# Patient Record
Sex: Female | Born: 1990
Health system: Southern US, Community
[De-identification: ages and names within clinical notes are randomized; demographics above are authoritative.]

## PROBLEM LIST (undated history)

## (undated) DIAGNOSIS — R7303 Prediabetes: Secondary | ICD-10-CM

## (undated) DIAGNOSIS — E669 Obesity, unspecified: Secondary | ICD-10-CM

## (undated) DIAGNOSIS — D649 Anemia, unspecified: Secondary | ICD-10-CM

## (undated) DIAGNOSIS — I10 Essential (primary) hypertension: Secondary | ICD-10-CM

## (undated) HISTORY — DX: Anemia, unspecified: D64.9

## (undated) HISTORY — PX: TONSILLECTOMY: SUR1361

## (undated) HISTORY — DX: Prediabetes: R73.03

---
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
## 2015-08-28 ENCOUNTER — Encounter (HOSPITAL_COMMUNITY): Payer: Self-pay

## 2015-08-28 ENCOUNTER — Observation Stay (HOSPITAL_COMMUNITY)
Admission: EM | Admit: 2015-08-28 | Discharge: 2015-08-29 | Disposition: A | Discharge status: Other Acute Inpatient Hospital | Payer: PRIVATE HEALTH INSURANCE | Source: Intra-hospital | Attending: Obstetrics & Gynecology | Admitting: Obstetrics & Gynecology

## 2015-08-28 DIAGNOSIS — D500 Iron deficiency anemia secondary to blood loss (chronic): Secondary | ICD-10-CM | POA: Diagnosis not present

## 2015-08-28 DIAGNOSIS — N939 Abnormal uterine and vaginal bleeding, unspecified: Principal | ICD-10-CM | POA: Insufficient documentation

## 2015-08-28 DIAGNOSIS — D649 Anemia, unspecified: Secondary | ICD-10-CM

## 2015-08-28 HISTORY — DX: Obesity, unspecified: E66.9

## 2015-08-28 LAB — CBC WITH DIFFERENTIAL/PLATELET
Basophils Absolute: 0 10*3/uL (ref 0.0–0.1)
Basophils Relative: 0 %
Eosinophils Absolute: 0.2 10*3/uL (ref 0.0–0.7)
Eosinophils Relative: 2 %
HCT: 18.8 % — ABNORMAL LOW (ref 36.0–46.0)
Hemoglobin: 4.9 g/dL — CL (ref 12.0–15.0)
Lymphocytes Relative: 31 %
Lymphs Abs: 3.7 10*3/uL (ref 0.7–4.0)
MCH: 15.6 pg — ABNORMAL LOW (ref 26.0–34.0)
MCHC: 26.1 g/dL — ABNORMAL LOW (ref 30.0–36.0)
MCV: 59.7 fL — ABNORMAL LOW (ref 78.0–100.0)
Monocytes Absolute: 0.7 10*3/uL (ref 0.1–1.0)
Monocytes Relative: 6 %
Neutro Abs: 7.2 10*3/uL (ref 1.7–7.7)
Neutrophils Relative %: 61 %
Platelets: 537 10*3/uL — ABNORMAL HIGH (ref 150–400)
RBC: 3.15 MIL/uL — ABNORMAL LOW (ref 3.87–5.11)
RDW: 22.9 % — ABNORMAL HIGH (ref 11.5–15.5)
WBC: 11.8 10*3/uL — ABNORMAL HIGH (ref 4.0–10.5)

## 2015-08-28 LAB — COMPREHENSIVE METABOLIC PANEL
ALT: 15 U/L (ref 14–54)
AST: 21 U/L (ref 15–41)
Albumin: 3.9 g/dL (ref 3.5–5.0)
Alkaline Phosphatase: 55 U/L (ref 38–126)
Anion gap: 9 (ref 5–15)
BUN: 11 mg/dL (ref 6–20)
CO2: 23 mmol/L (ref 22–32)
Calcium: 8.9 mg/dL (ref 8.9–10.3)
Chloride: 107 mmol/L (ref 101–111)
Creatinine, Ser: 0.63 mg/dL (ref 0.44–1.00)
GFR calc Af Amer: >60 mL/min (ref >60)
GFR calc non Af Amer: >60 mL/min (ref >60)
Glucose, Bld: 87 mg/dL (ref 65–99)
Potassium: 4.1 mmol/L (ref 3.5–5.1)
Sodium: 139 mmol/L (ref 135–145)
Total Bilirubin: 0.6 mg/dL (ref 0.3–1.2)
Total Protein: 7.3 g/dL (ref 6.5–8.1)

## 2015-08-28 LAB — ABO/RH: ABO/RH(D): O POS

## 2015-08-28 LAB — POC OCCULT BLOOD, ED: Fecal Occult Bld: NEGATIVE

## 2015-08-28 LAB — CBG MONITORING, ED: Glucose-Capillary: 83 mg/dL (ref 65–99)

## 2015-08-28 LAB — PREPARE RBC (CROSSMATCH)

## 2015-08-28 MED ORDER — ESTROGENS CONJUGATED 25 MG IJ SOLR
25.0000 mg | Freq: Once | INTRAMUSCULAR | Status: AC
  Administered 2015-08-28: 25 mg via INTRAVENOUS
  Filled 2015-08-28: qty 25

## 2015-08-28 MED ORDER — SODIUM CHLORIDE 0.9 % IV SOLN
1000.0000 mL | Freq: Once | INTRAVENOUS | Status: AC
  Administered 2015-08-28: 1000 mL via INTRAVENOUS

## 2015-08-28 MED ORDER — SODIUM CHLORIDE 0.9 % IV SOLN
1000.0000 mL | INTRAVENOUS | Status: DC
  Administered 2015-08-28 – 2015-08-29 (×2): 1000 mL via INTRAVENOUS

## 2015-08-28 MED ORDER — MEGESTROL ACETATE 40 MG PO TABS
120.0000 mg | ORAL_TABLET | Freq: Every day | ORAL | Status: AC
  Administered 2015-08-28: 120 mg via ORAL
  Filled 2015-08-28: qty 3

## 2015-08-28 NOTE — ED Notes (Signed)
IV team unable to perform blood draw during peripheral IV access attempt.  Main lab phlebotomist notified that we need help obtaining blood samples.

## 2015-08-28 NOTE — ED Notes (Signed)
1x attempt at blood draw - unsuccessful - RN aware - hard stick

## 2015-08-28 NOTE — ED Notes (Signed)
Patient reports a menstrual period x 30 days. Patient c/o dizziness and feeling tired. Patient went to an UC today and was told to come to the ED because Hgb-5.3.

## 2015-08-28 NOTE — ED Notes (Signed)
Per Dr. Radford PaxBeaton, infuse blood at 26050mL/hr

## 2015-08-28 NOTE — ED Notes (Signed)
Main lab phlebotomist unable to draw blood for labs.  Provider notified.

## 2015-08-28 NOTE — ED Provider Notes (Signed)
CSN: [id number]     Arrival date & time 08/28/15  1702 History   First MD Initiated Contact with Patient 08/28/15 1712     Chief Complaint  Patient presents with  . low hgb-5.3      (Consider location/radiation/quality/duration/timing/severity/associated sxs/prior Treatment) The history is provided by the patient.     Patient is a 25 year old morbidly obese female, otherwise healthy, who presents to the emergency room as a transfer from urgent care due to lab hemoglobin of 5.3, after having 6 weeks of vaginal bleeding, became symptomatic last night feeling lightheaded, dizzy and feeling a rapid heart rate.  She states that today she is only saturated one tampon and her period feels like it has finally slowed down.  She denies  Current vaginal bleeding, she has not passed any large blood clots today.  Her last period began the beginning of December and has been constant since then, with estimated 4 pads soaked per day.  She has a hx of heavy and frequent periods.  She has been on birth control in the past to help regulate menses, which she states was effective, but reports that she stopped using BC due to excessive weight gain.  She estimates "several months" of more frequent long periods.  In October she had a period for two weeks, then reports two weeks without vaginal bleeding.  She normally has two days of light cramping at the beginning of her cycle.  She last had sex in October, has had a negative home pregnancy test since then, and denies possibility of pregnancy. At the time of exam she endorses HA and lightheadedness with positional changes or exertion, has exertional SOB and feels rapid heart rate, and otherwise feels generally weak  She felt symptoms yesterday.  She denies chest pain, SOB at rest, abdominal pain, pelvic pain.  She denies pallor, color change, syncope.  She denies hematemesis, hematochezia, melena. No other acute complaints.  She denies any family or personal bleeding  disorders.  She denies having blood transfusion before.   Past Medical History  Diagnosis Date  . Obesity    Past Surgical History  Procedure Laterality Date  . Tonsillectomy     Family History  Problem Relation Age of Onset  . Cancer Father    Social History  Substance Use Topics  . Smoking status: Never Smoker   . Smokeless tobacco: Never Used  . Alcohol Use: Yes     Comment: occasionally   OB History    No data available     Review of Systems  Constitutional: Positive for fatigue. Negative for fever, chills, diaphoresis, activity change and appetite change.  Eyes: Negative.   Respiratory: Negative for apnea, cough, choking, chest tightness, wheezing and stridor.   Cardiovascular: Negative for chest pain, palpitations and leg swelling.  Gastrointestinal: Negative.  Negative for nausea, vomiting, abdominal pain, diarrhea, blood in stool, abdominal distention and anal bleeding.  Endocrine: Negative.   Genitourinary: Positive for vaginal bleeding and menstrual problem. Negative for dysuria, urgency, frequency, hematuria, flank pain, decreased urine volume, vaginal discharge, difficulty urinating, vaginal pain and pelvic pain.  Musculoskeletal: Negative.   Skin: Negative.   Neurological: Positive for dizziness, weakness, light-headedness and headaches. Negative for syncope.  All other systems reviewed and are negative.     Allergies  Review of patient's allergies indicates no known allergies.  Home Medications   Prior to Admission medications   Medication Sig Start Date End Date Taking? Authorizing Provider  ibuprofen (ADVIL,MOTRIN) 200 MG tablet Take  600 mg by mouth every 6 (six) hours as needed for headache.   Yes Historical Provider, MD   BP 105/57 mmHg  Pulse 94  Temp(Src) 98.6 F (37 C) (Oral)  Resp 16  Ht 5\' 6"  (1.676 m)  Wt 184.16 kg  BMI 65.56 kg/m2  SpO2 100%  LMP 08/28/2015 Physical Exam  Constitutional: She is oriented to person, place, and time.  Vital signs are normal. She appears well-developed and well-nourished. She is cooperative.  Non-toxic appearance. She does not have a sickly appearance. She does not appear ill. No distress.  HENT:  Head: Normocephalic and atraumatic.  Nose: Nose normal.  Mouth/Throat: Uvula is midline, oropharynx is clear and moist and mucous membranes are normal. Mucous membranes are not pale, not dry and not cyanotic. No oropharyngeal exudate.  Palpebra conjunctiva pale  Eyes: Conjunctivae and EOM are normal. Pupils are equal, round, and reactive to light. Right eye exhibits no discharge. Left eye exhibits no discharge. No scleral icterus.  Neck: Normal range of motion. No JVD present. No tracheal deviation present. No thyromegaly present.  Cardiovascular: Normal rate, regular rhythm, normal heart sounds and intact distal pulses.  Exam reveals no gallop and no friction rub.   No murmur heard. Pulses:      Radial pulses are 2+ on the right side, and 2+ on the left side.  Pulmonary/Chest: Effort normal and breath sounds normal. No respiratory distress. She has no wheezes. She has no rales. She exhibits no tenderness.  Abdominal: Soft. Bowel sounds are normal. She exhibits no distension and no mass. There is no tenderness. There is no rebound and no guarding.  Abdomen obese, soft, NTND, BSx4  Musculoskeletal: Normal range of motion. She exhibits no edema or tenderness.  Lymphadenopathy:    She has no cervical adenopathy.  Neurological: She is alert and oriented to person, place, and time. She has normal reflexes. No cranial nerve deficit. She exhibits normal muscle tone. Coordination normal.  Skin: Skin is warm and dry. No rash noted. She is not diaphoretic. No erythema. No pallor.  Lips dry, no cyanosis, normal capillary refill  Psychiatric: She has a normal mood and affect. Her behavior is normal. Judgment and thought content normal.  Nursing note and vitals reviewed.   ED Course  Procedures (including  critical care time) Labs Review Labs Reviewed  CBC WITH DIFFERENTIAL/PLATELET - Abnormal; Notable for the following:    WBC 11.8 (*)    RBC 3.15 (*)    Hemoglobin 4.9 (*)    HCT 18.8 (*)    MCV 59.7 (*)    MCH 15.6 (*)    MCHC 26.1 (*)    RDW 22.9 (*)    Platelets 537 (*)    All other components within normal limits  COMPREHENSIVE METABOLIC PANEL  HCG, QUANTITATIVE, PREGNANCY  PROTIME-INR  APTT  CBC  POC OCCULT BLOOD, ED  CBG MONITORING, ED  TYPE AND SCREEN  PREPARE RBC (CROSSMATCH)  ABO/RH  TYPE AND SCREEN  PREPARE RBC (CROSSMATCH)  ABO/RH    Imaging Review No results found. I have personally reviewed and evaluated these images and lab results as part of my medical decision-making.   EKG Interpretation None      MDM   Pt with reported 6 weeks of heavy menses, improved today, however became lightheaded, weak, with rapid pulse last night.  Went to urgent care for evaluation, was sent to ER for Hb 5.3.  At presentation VSS, HR 93-100, pt appears comfortable, non-toxic in appearance, no  tachypnea She denies sx at rest.  Work up started for basic labs, coags, type and screen.  Discussed need for blood transfusion.  Several hour delay in obtaining labs, due to difficult IV start.  Multiple attempts made by phlebotomy, RN's, IV start team.  Left hand 22g IV placed, unable to draw labs, but IVF's going in.  Dr[facility] Rad[facility]aton [facility]  Cross match and PRBC ordered and infusion began into IV.  Dr. Radford PaxBeaton consulted OBGYN oncall provider, Dr. Gust RungStenson.  He accepted for transfer to MAU.  Will initiate transfer once 2 unit of blood is initiated.  Dr. Gust RungStenson requested IV premarin and Megace be ordered.  Pt was reassessed after the first unit of blood was transfused, she reported feeling much better.  No adverse reactions noted.  Dr. Clydene PughKnott is current attending, who received report from Dr. Radford PaxBeaton at shift change, and has filled out EMTALA.  Will transfer to MAU once  2nd unit is hung.  CRITICAL CARE Performed by: Danelle BerryLeisa Evaline Waltman Total critical care time: 45 min Critical care time was exclusive of separately billable procedures and treating other patients. Critical care was necessary to treat or prevent imminent or life-threatening deterioration. Critical care was time spent personally by me on the following activities: development of treatment plan with patient and/or surrogate as well as nursing, discussions with consultants, evaluation of patient's response to treatment, examination of patient, obtaining history from patient or surrogate, ordering and performing treatments and interventions, ordering and review of laboratory studies, ordering and review of radiographic studies, pulse oximetry and re-evaluation of patient's condition.  Final diagnoses:  Vaginal bleeding  Anemia, unspecified anemia type       Danelle BerryLeisa Makilah Dowda, PA-C 08/29/15 1153  Nelva Nayobert Beaton, MD 09/02/15 1623

## 2015-08-29 ENCOUNTER — Encounter (HOSPITAL_COMMUNITY): Payer: Self-pay

## 2015-08-29 DIAGNOSIS — N939 Abnormal uterine and vaginal bleeding, unspecified: Principal | ICD-10-CM

## 2015-08-29 DIAGNOSIS — D500 Iron deficiency anemia secondary to blood loss (chronic): Secondary | ICD-10-CM | POA: Diagnosis present

## 2015-08-29 LAB — CBC
HCT: 26 % — ABNORMAL LOW (ref 36.0–46.0)
Hemoglobin: 7.7 g/dL — ABNORMAL LOW (ref 12.0–15.0)
MCH: 19.5 pg — ABNORMAL LOW (ref 26.0–34.0)
MCHC: 29.6 g/dL — ABNORMAL LOW (ref 30.0–36.0)
MCV: 65.8 fL — ABNORMAL LOW (ref 78.0–100.0)
Platelets: 486 10*3/uL — ABNORMAL HIGH (ref 150–400)
RBC: 3.95 MIL/uL (ref 3.87–5.11)
RDW: 29.1 % — ABNORMAL HIGH (ref 11.5–15.5)
WBC: 15.3 10*3/uL — ABNORMAL HIGH (ref 4.0–10.5)

## 2015-08-29 LAB — ABO/RH: ABO/RH(D): O POS

## 2015-08-29 LAB — HCG, QUANTITATIVE, PREGNANCY: hCG, Beta Chain, Quant, S: <1 m[IU]/mL (ref <5)

## 2015-08-29 LAB — PREPARE RBC (CROSSMATCH)

## 2015-08-29 MED ORDER — FERROUS SULFATE 325 (65 FE) MG PO TABS: 325.0000 mg | ORAL_TABLET | Freq: Two times a day (BID) | ORAL | Status: DC

## 2015-08-29 MED ORDER — FUROSEMIDE 10 MG/ML IJ SOLN
20.0000 mg | Freq: Once | INTRAMUSCULAR | Status: AC
  Administered 2015-08-29: 20 mg via INTRAVENOUS
  Filled 2015-08-29: qty 2

## 2015-08-29 MED ORDER — SODIUM CHLORIDE 0.9 % IV SOLN
Freq: Once | INTRAVENOUS | Status: AC
  Administered 2015-08-29: 05:00:00 via INTRAVENOUS

## 2015-08-29 MED ORDER — MEGESTROL ACETATE 40 MG PO TABS: 40.0000 mg | ORAL_TABLET | Freq: Two times a day (BID) | ORAL | Status: DC

## 2015-08-29 NOTE — Progress Notes (Signed)
Lab draw for type and screen successful/

## 2015-08-29 NOTE — H&P (Signed)
Patient is a 25 year old morbidly obese female, otherwise healthy, who presents to the emergency room as a transfer from urgent care due to lab hemoglobin of 5.3, after having 6 weeks of vaginal bleeding, became symptomatic last night feeling lightheaded, dizzy and feeling a rapid heart rate. She states that today she is only saturated one tampon and her period feels like it has finally slowed down. She denies Current vaginal bleeding, she has not passed any large blood clots today. Her last period began the beginning of December and has been constant since then, with estimated 4 pads soaked per day. She has a hx of heavy and frequent periods. She has been on birth control in the past to help regulate menses, which she states was effective, but reports that she stopped using BC due to excessive weight gain. She estimates "several months" of more frequent long periods. In October she had a period for two weeks, then reports two weeks without vaginal bleeding. She normally has two days of light cramping at the beginning of her cycle. She last had sex in October, has had a negative home pregnancy test since then, and denies possibility of pregnancy. At the time of exam she endorses HA and lightheadedness with positional changes or exertion, has exertional SOB and feels rapid heart rate, and otherwise feels generally weak She felt symptoms yesterday. She denies chest pain, SOB at rest, abdominal pain, pelvic pain. She denies pallor, color change, syncope. She denies hematemesis, hematochezia, melena. No other acute complaints. She denies any family or personal bleeding disorders. She denies having blood transfusion before.   Past Medical History  Diagnosis Date  . Obesity    Past Surgical History  Procedure Laterality Date  . Tonsillectomy     Family History  Problem Relation Age of Onset  . Cancer Father    Social History  Substance Use Topics  . Smoking  status: Never Smoker   . Smokeless tobacco: Never Used  . Alcohol Use: Yes     Comment: occasionally   OB History    No data available     Review of Systems  Constitutional: Positive for fatigue. Negative for fever, chills, diaphoresis, activity change and appetite change.  Eyes: Negative.  Respiratory: Negative for apnea, cough, choking, chest tightness, wheezing and stridor.  Cardiovascular: Negative for chest pain, palpitations and leg swelling.  Gastrointestinal: Negative. Negative for nausea, vomiting, abdominal pain, diarrhea, blood in stool, abdominal distention and anal bleeding.  Endocrine: Negative.  Genitourinary: Positive for vaginal bleeding and menstrual problem. Negative for dysuria, urgency, frequency, hematuria, flank pain, decreased urine volume, vaginal discharge, difficulty urinating, vaginal pain and pelvic pain.  Musculoskeletal: Negative.  Skin: Negative.  Neurological: Positive for dizziness, weakness, light-headedness and headaches. Negative for syncope.  All other systems reviewed and are negative.     Allergies  Review of patient's allergies indicates no known allergies.  Home Medications   Prior to Admission medications   Medication Sig Start Date End Date Taking? Authorizing Provider  ibuprofen (ADVIL,MOTRIN) 200 MG tablet Take 600 mg by mouth every 6 (six) hours as needed for headache.   Yes Historical Provider, MD   BP 105/57 mmHg  Pulse 94  Temp(Src) 98.6 F (37 C) (Oral)  Resp 16  Ht 5\' 6"  (1.676 m)  Wt 184.16 kg  BMI 65.56 kg/m2  SpO2 100%  LMP 08/28/2015 Physical Exam  Constitutional: She is oriented to person, place, and time. Vital signs are normal. She appears well-developed and well-nourished. She is cooperative.  Non-toxic appearance. She does not have a sickly appearance. She does not appear ill. No distress.  HENT:  Head: Normocephalic and atraumatic.  Nose: Nose normal.   Mouth/Throat: Uvula is midline, oropharynx is clear and moist and mucous membranes are normal. Mucous membranes are not pale, not dry and not cyanotic. No oropharyngeal exudate.  Palpebra conjunctiva pale  Eyes: Conjunctivae and EOM are normal. Pupils are equal, round, and reactive to light. Right eye exhibits no discharge. Left eye exhibits no discharge. No scleral icterus.  Neck: Normal range of motion. No JVD present. No tracheal deviation present. No thyromegaly present.  Cardiovascular: Normal rate, regular rhythm, normal heart sounds and intact distal pulses. Exam reveals no gallop and no friction rub.  No murmur heard. Pulses:  Radial pulses are 2+ on the right side, and 2+ on the left side.  Pulmonary/Chest: Effort normal and breath sounds normal. No respiratory distress. She has no wheezes. She has no rales. She exhibits no tenderness.  Abdominal: Soft. Bowel sounds are normal. She exhibits no distension and no mass. There is no tenderness. There is no rebound and no guarding.  Abdomen obese, soft, NTND, BSx4  Musculoskeletal: Normal range of motion. She exhibits no edema or tenderness.  Lymphadenopathy:   She has no cervical adenopathy.  Neurological: She is alert and oriented to person, place, and time. She has normal reflexes. No cranial nerve deficit. She exhibits normal muscle tone. Coordination normal.  Skin: Skin is warm and dry. No rash noted. She is not diaphoretic. No erythema. No pallor.  Lips dry, no cyanosis, normal capillary refill  Psychiatric: She has a normal mood and affect. Her behavior is normal. Judgment and thought content normal.  Nursing note and vitals reviewed.   ED Course  Procedures (including critical care time) Labs Review Labs Reviewed  CBC WITH DIFFERENTIAL/PLATELET - Abnormal; Notable for the following:    WBC 11.8 (*)    RBC 3.15 (*)    Hemoglobin 4.9 (*)    HCT 18.8 (*)    MCV 59.7 (*)    MCH 15.6 (*)     MCHC 26.1 (*)    RDW 22.9 (*)    Platelets 537 (*)    All other components within normal limits  COMPREHENSIVE METABOLIC PANEL  HCG, QUANTITATIVE, PREGNANCY  PROTIME-INR  APTT  CBC  POC OCCULT BLOOD, ED  CBG MONITORING, ED  TYPE AND SCREEN  PREPARE RBC (CROSSMATCH)  ABO/RH  TYPE AND SCREEN  PREPARE RBC (CROSSMATCH)  ABO/RH    Imaging Review  Imaging Results (Last 48 hours)    No results found.   I have personally reviewed and evaluated these images and lab results as part of my medical decision-making.   EKG Interpretation None    MDM   Pt with reported 6 weeks of heavy menses, improved today, however became lightheaded, weak, with rapid pulse last night. Went to urgent care for evaluation, was sent to ER for Hb 5.3.  At presentation VSS, HR 93-100, pt appears comfortable, non-toxic in appearance, no tachypnea She denies sx at rest. Work up started for basic labs, coags, type and screen. Discussed need for blood transfusion.  Several hour delay in obtaining labs, due to difficult IV start. Multiple attempts made by phlebotomy, RN's, IV start team. Left hand 22g IV placed, unable to draw labs, but IVF's going in. Dr. Radford Pax obtained blood from right [facility]. Cross match and PRBC ordered and infusion began into IV.  Dr. Radford Pax consulted OBGYN oncall provider, Dr.  Stenson. He accepted for transfer to MAU. Will initiate transfer once 2 unit of blood is initiated. Dr. Gust RungStenson requested IV premarin and Megace be ordered.  Pt was reassessed after the first unit of blood was transfused, she reported feeling much better. No adverse reactions noted. Dr. Clydene PughKnott is current attending, who received report from Dr. Radford PaxBeaton at shift change, and has filled out EMTALA. Will transfer to MAU once 2nd unit is hung.  CRITICAL CARE Performed by: Danelle BerryLeisa Tapia Total critical care time: 45 min Critical care time was exclusive of separately billable  procedures and treating other patients. Critical care was necessary to treat or prevent imminent or life-threatening deterioration. Critical care was time spent personally by me on the following activities: development of treatment plan with patient and/or surrogate as well as nursing, discussions with consultants, evaluation of patient's response to treatment, examination of patient, obtaining history from patient or surrogate, ordering and performing treatments and interventions, ordering and review of laboratory studies, ordering and review of radiographic studies, pulse oximetry and re-evaluation of patient's condition.  Final diagnoses:  DUB  Anemia due to chronic blood loss    4 U PRBC IVpremarin til bleeding ceases + megestrol 120 mg daily Chronic management needed           Lazaro ArmsEURE,Livianna Petraglia H, MD

## 2015-08-29 NOTE — ED Notes (Signed)
Pt continues to rest in bed with blood transfusing; no signs of reaction noted.  Her mother is with her at the bedside.  Pt has been allowed to eat and drink per MD approval.  All vitals remain WNL during transfusion. No acute distress and no needs expressed at this time.

## 2015-08-29 NOTE — Progress Notes (Signed)
Discharge instructions reviewed with patient, all questions answered. IV taken out upon discharge no equipment sent home with patient.  In stable condition.  All belongings sent home with patient.  Follow-up care and prescriptions reviewed.  Pt escorted out by NT to front entrance.

## 2015-08-29 NOTE — Discharge Instructions (Signed)
Abnormal Uterine Bleeding °Abnormal uterine bleeding means bleeding from the vagina that is not your normal menstrual period. This can be: °· Bleeding or spotting between periods. °· Bleeding after sex (sexual intercourse). °· Bleeding that is heavier or more than normal. °· Periods that last longer than usual. °· Bleeding after menopause. °There are many problems that may cause this. Treatment will depend on the cause of the bleeding. Any kind of bleeding that is not normal should be reviewed by your doctor.  °HOME CARE °Watch your condition for any changes. These actions may lessen any discomfort you are having: °· Do not use tampons or douches as told by your doctor. °· Change your pads often. °You should get regular pelvic exams and Pap tests. Keep all appointments for tests as told by your doctor. °GET HELP IF: °· You are bleeding for more than 1 week. °· You feel dizzy at times. °GET HELP RIGHT AWAY IF:  °· You pass out. °· You have to change pads every 15 to 30 minutes. °· You have belly pain. °· You have a fever. °· You become sweaty or weak. °· You are passing large blood clots from the vagina. °· You feel sick to your stomach (nauseous) and throw up (vomit). °MAKE SURE YOU: °· Understand these instructions. °· Will watch your condition. °· Will get help right away if you are not doing well or get worse. °  °This information is not intended to replace advice given to you by your health care provider. Make sure you discuss any questions you have with your health care provider. °  °Document Released: 05/29/2009 Document Revised: 08/06/2013 Document Reviewed: 02/28/2013 °Elsevier Interactive Patient Education ©2016 [employer] ° °

## 2015-08-29 NOTE — Progress Notes (Signed)
Patient arrived via [facility] at [id number].  Mother at bedside for support.  Patient presents in stable condition, denies pain, small amount of vaginal bleeding. Patient ambulated to bed and bathroom.  Denies dizziness, weakness, lightheadedness, or headache.  Dr. Despina HiddenEure called and verbal orders taken upon arrival.  Lab technician attempt x2 to draw Type and Screen, unsuccessful.  Another Lab technician will return at 5 am to draw. Patient resting comfortably in bed, eating and drinking .

## 2015-08-29 NOTE — Discharge Summary (Signed)
Physician Discharge Summary  Patient ID: Stacey Reynolds MRN: [id number] DOB/AGE: 09-Oct-1990 24 y.o.  Admit date: 08/28/2015 Discharge date: 08/29/2015  Admission Diagnoses:DUB, severe anemia  Discharge Diagnoses:Same Active Problems:   Anemia due to blood loss, chronic   Discharged Condition: fair Author: Lazaro Arms, MD Service: Obstetrics/Gynecology Author Type: Physician    Filed: 08/29/2015 12:05 PM Note Time: 08/29/2015 7:34 AM Status: Signed   Editor: Lazaro Arms, MD (Physician)     Expand All Collapse All   Patient is a 25 year old morbidly obese female, otherwise healthy, who presents to the emergency room as a transfer from urgent care due to lab hemoglobin of 5.3, after having 6 weeks of vaginal bleeding, became symptomatic last night feeling lightheaded, dizzy and feeling a rapid heart rate. She states that today she is only saturated one tampon and her period feels like it has finally slowed down. She denies Current vaginal bleeding, she has not passed any large blood clots today. Her last period began the beginning of December and has been constant since then, with estimated 4 pads soaked per day. She has a hx of heavy and frequent periods. She has been on birth control in the past to help regulate menses, which she states was effective, but reports that she stopped using BC due to excessive weight gain. She estimates "several months" of more frequent long periods. In October she had a period for two weeks, then reports two weeks without vaginal bleeding. She normally has two days of light cramping at the beginning of her cycle. She last had sex in October, has had a negative home pregnancy test since then, and denies possibility of pregnancy. At the time of exam she endorses HA and lightheadedness with positional changes or exertion, has exertional SOB and feels rapid heart rate, and otherwise feels generally weak She felt symptoms yesterday. She denies chest  pain, SOB at rest, abdominal pain, pelvic pain. She denies pallor, color change, syncope. She denies hematemesis, hematochezia, melena. No other acute complaints. She denies any family or personal bleeding disorders. She denies having blood transfusion before.   Past Medical History  Diagnosis Date  . Obesity    Past Surgical History  Procedure Laterality Date  . Tonsillectomy     Family History  Problem Relation Age of Onset  . Cancer Father    Social History  Substance Use Topics  . Smoking status: Never Smoker   . Smokeless tobacco: Never Used  . Alcohol Use: Yes     Comment: occasionally   OB History    No data available     Review of Systems  Constitutional: Positive for fatigue. Negative for fever, chills, diaphoresis, activity change and appetite change.  Eyes: Negative.  Respiratory: Negative for apnea, cough, choking, chest tightness, wheezing and stridor.  Cardiovascular: Negative for chest pain, palpitations and leg swelling.  Gastrointestinal: Negative. Negative for nausea, vomiting, abdominal pain, diarrhea, blood in stool, abdominal distention and anal bleeding.  Endocrine: Negative.  Genitourinary: Positive for vaginal bleeding and menstrual problem. Negative for dysuria, urgency, frequency, hematuria, flank pain, decreased urine volume, vaginal discharge, difficulty urinating, vaginal pain and pelvic pain.  Musculoskeletal: Negative.  Skin: Negative.  Neurological: Positive for dizziness, weakness, light-headedness and headaches. Negative for syncope.  All other systems reviewed and are negative.     Allergies  Review of patient's allergies indicates no known allergies.  Home Medications   Prior to Admission medications   Medication Sig Start Date End Date Taking? Authorizing Provider  ibuprofen (ADVIL,MOTRIN) 200 MG  tablet Take 600 mg by mouth every 6 (six) hours as needed for headache.   Yes Historical Provider, MD   BP 105/57 mmHg  Pulse 94  Temp(Src) 98.6 F (37 C) (Oral)  Resp 16  Ht 5\' 6"  (1.676 m)  Wt 184.16 kg  BMI 65.56 kg/m2  SpO2 100%  LMP 08/28/2015 Physical Exam  Constitutional: She is oriented to person, place, and time. Vital signs are normal. She appears well-developed and well-nourished. She is cooperative. Non-toxic appearance. She does not have a sickly appearance. She does not appear ill. No distress.  HENT:  Head: Normocephalic and atraumatic.  Nose: Nose normal.  Mouth/Throat: Uvula is midline, oropharynx is clear and moist and mucous membranes are normal. Mucous membranes are not pale, not dry and not cyanotic. No oropharyngeal exudate.  Palpebra conjunctiva pale  Eyes: Conjunctivae and EOM are normal. Pupils are equal, round, and reactive to light. Right eye exhibits no discharge. Left eye exhibits no discharge. No scleral icterus.  Neck: Normal range of motion. No JVD present. No tracheal deviation present. No thyromegaly present.  Cardiovascular: Normal rate, regular rhythm, normal heart sounds and intact distal pulses. Exam reveals no gallop and no friction rub.  No murmur heard. Pulses:  Radial pulses are 2+ on the right side, and 2+ on the left side.  Pulmonary/Chest: Effort normal and breath sounds normal. No respiratory distress. She has no wheezes. She has no rales. She exhibits no tenderness.  Abdominal: Soft. Bowel sounds are normal. She exhibits no distension and no mass. There is no tenderness. There is no rebound and no guarding.  Abdomen obese, soft, NTND, BSx4  Musculoskeletal: Normal range of motion. She exhibits no edema or tenderness.  Lymphadenopathy:   She has no cervical adenopathy.  Neurological: She is alert and oriented to person, place, and time. She has normal reflexes. No cranial nerve deficit. She exhibits normal  muscle tone. Coordination normal.  Skin: Skin is warm and dry. No rash noted. She is not diaphoretic. No erythema. No pallor.  Lips dry, no cyanosis, normal capillary refill  Psychiatric: She has a normal mood and affect. Her behavior is normal. Judgment and thought content normal.  Nursing note and vitals reviewed.   ED Course  Procedures (including critical care time) Labs Review Labs Reviewed  CBC WITH DIFFERENTIAL/PLATELET - Abnormal; Notable for the following:    WBC 11.8 (*)    RBC 3.15 (*)    Hemoglobin 4.9 (*)    HCT 18.8 (*)    MCV 59.7 (*)    MCH 15.6 (*)    MCHC 26.1 (*)    RDW 22.9 (*)    Platelets 537 (*)    All other components within normal limits  COMPREHENSIVE METABOLIC PANEL  HCG, QUANTITATIVE, PREGNANCY  PROTIME-INR  APTT  CBC  POC OCCULT BLOOD, ED  CBG MONITORING, ED  TYPE AND SCREEN  PREPARE RBC (CROSSMATCH)  ABO/RH  TYPE AND SCREEN  PREPARE RBC (CROSSMATCH)  ABO/RH    Imaging Review  Imaging Results (Last 48 hours)    No results found.   I have personally reviewed and evaluated these images and lab results as part of my medical decision-making.  EKG Interpretation None    MDM   Pt with reported 6 weeks of heavy menses, improved today, however became lightheaded, weak, with rapid pulse last night. Went to urgent care for evaluation, was sent to ER for Hb 5.3.  At presentation VSS, HR 93-100, pt appears  comfortable, non-toxic in appearance, no tachypnea She denies sx at rest. Work up started for basic labs, coags, type and screen. Discussed need for blood transfusion.  Several hour delay in obtaining labs, due to difficult IV start. Multiple attempts made by phlebotomy, RN's, IV start team. Left hand 22g IV placed, unable to draw labs, but IVF's going in. Dr. Radford Pax obtained blood from right [facility] Cross match and  PRBC ordered and infusion began into IV.  Dr. Radford Pax consulted OBGYN oncall provider, Dr. Gust Rung. He accepted for transfer to [facility]U. Will initiate transfer once 2 unit of blood is initiated. Dr. Gust Rung requested IV premarin and Megace be ordered.  Pt was reassessed after the first unit of blood was transfused, she reported feeling much better. No adverse reactions noted. Dr. Clydene Pugh is current attending, who received report from Dr. Radford Pax at shift change, and has filled out EMTALA. Will transfer to [facility]U once 2nd unit is hung.  CRITICAL CARE Performed by: Danelle Berry Total critical care time: 45 min Critical care time was exclusive of separately billable procedures and treating other patients. Critical care was necessary to treat or prevent imminent or life-threatening deterioration. Critical care was time spent personally by me on the following activities: development of treatment plan with patient and/or surrogate as well as nursing, discussions with consultants, evaluation of patient's response to treatment, examination of patient, obtaining history from patient or surrogate, ordering and performing treatments and interventions, ordering and review of laboratory studies, ordering and review of radiographic studies, pulse oximetry and re-evaluation of patient's condition.  Final diagnoses:  DUB  Anemia due to chronic blood loss    4 U PRBC IVpremarin til bleeding ceases + megestrol 120 mg daily Chronic management needed           Lazaro Arms, MD       Hospital Course: Patient received 4 units PRBC and hemoglobin was 7.7 and was asymptomatic  Consults: None  Significant Diagnostic Studies: labs:  CBC    Component Value Date/Time   WBC 15.3* 08/29/2015 0452   RBC 3.95 08/29/2015 0452   HGB 7.7* 08/29/2015 0452   HCT 26.0* 08/29/2015 0452   PLT 486* 08/29/2015 0452   MCV 65.8* 08/29/2015 0452   MCH 19.5* 08/29/2015 0452   MCHC 29.6* 08/29/2015 0452    RDW 29.1* 08/29/2015 0452   LYMPHSABS 3.7 08/28/2015 2156   MONOABS 0.7 08/28/2015 2156   EOSABS 0.2 08/28/2015 2156   BASOSABS 0.0 08/28/2015 2156      Treatments: transfusion 4 units PRBC  Discharge Exam: Blood pressure 147/66, pulse 90, temperature 98.2 F (36.8 C), temperature source Oral, resp. rate 18, height 5\' 6"  (1.676 m), weight 184.16 kg (406 lb), last menstrual period 08/28/2015, SpO2 100 %. General appearance: alert, cooperative and no distress GI: non distended, obese Extremities: extremities normal, atraumatic, no cyanosis or edema  Disposition: Final discharge disposition not confirmed     Medication List    TAKE these medications        ibuprofen 200 MG tablet  Commonly known as:  ADVIL,MOTRIN  Take 600 mg by mouth every 6 (six) hours as needed for headache.     megestrol 40 MG tablet  Commonly known as:  MEGACE  Take 1 tablet (40 mg total) by mouth 2 (two) times daily.      FeSO4 325 mg BID     Follow-up Information    Follow up with [facility] In 3 weeks.   Specialty:  Obstetrics and Gynecology  Contact information:   [address] [place] [address] [phone]      Signed: Scheryl Darter 08/29/2015, 6:01 PM

## 2015-08-29 NOTE — ED Notes (Signed)
[facility] by [facility]

## 2015-08-30 LAB — TYPE AND SCREEN
ABO/RH(D): O POS
Antibody Screen: NEGATIVE
Unit division: 0
Unit division: 0

## 2015-09-01 LAB — TYPE AND SCREEN
ABO/RH(D): O POS
Antibody Screen: NEGATIVE
Unit division: 0
Unit division: 0
Unit division: 0
Unit division: 0
Unit division: 0

## 2015-09-23 ENCOUNTER — Ambulatory Visit (INDEPENDENT_AMBULATORY_CARE_PROVIDER_SITE_OTHER): Payer: PRIVATE HEALTH INSURANCE | Admitting: Obstetrics & Gynecology

## 2015-09-23 ENCOUNTER — Encounter: Payer: Self-pay | Admitting: Obstetrics & Gynecology

## 2015-09-23 VITALS — BP 151/105 | HR 101 | Temp 98.6°F | Ht 66.0 in | Wt >= 6400 oz

## 2015-09-23 DIAGNOSIS — Z01419 Encounter for gynecological examination (general) (routine) without abnormal findings: Secondary | ICD-10-CM

## 2015-09-23 DIAGNOSIS — R030 Elevated blood-pressure reading, without diagnosis of hypertension: Secondary | ICD-10-CM | POA: Diagnosis not present

## 2015-09-23 DIAGNOSIS — D500 Iron deficiency anemia secondary to blood loss (chronic): Secondary | ICD-10-CM | POA: Diagnosis not present

## 2015-09-23 DIAGNOSIS — IMO0001 Reserved for inherently not codable concepts without codable children: Secondary | ICD-10-CM

## 2015-09-23 DIAGNOSIS — N938 Other specified abnormal uterine and vaginal bleeding: Secondary | ICD-10-CM | POA: Diagnosis not present

## 2015-09-23 DIAGNOSIS — Z124 Encounter for screening for malignant neoplasm of cervix: Secondary | ICD-10-CM

## 2015-09-23 DIAGNOSIS — Z113 Encounter for screening for infections with a predominantly sexual mode of transmission: Secondary | ICD-10-CM | POA: Diagnosis not present

## 2015-09-23 DIAGNOSIS — Z0000 Encounter for general adult medical examination without abnormal findings: Secondary | ICD-10-CM

## 2015-09-23 LAB — CBC
HCT: 39.8 % (ref 36.0–46.0)
Hemoglobin: 11.9 g/dL — ABNORMAL LOW (ref 12.0–15.0)
MCH: 22.6 pg — ABNORMAL LOW (ref 26.0–34.0)
MCHC: 29.9 g/dL — ABNORMAL LOW (ref 30.0–36.0)
MCV: 75.7 fL — ABNORMAL LOW (ref 78.0–100.0)
Platelets: 635 10*3/uL — ABNORMAL HIGH (ref 150–400)
RBC: 5.26 MIL/uL — ABNORMAL HIGH (ref 3.87–5.11)
WBC: 12.7 10*3/uL — ABNORMAL HIGH (ref 4.0–10.5)

## 2015-09-23 LAB — TSH: TSH: 2.71 mIU/L

## 2015-09-23 MED ORDER — NORGESTREL-ETHINYL ESTRADIOL 0.3-30 MG-MCG PO TABS: 1.0000 | ORAL_TABLET | Freq: Every day | ORAL | Status: DC

## 2015-09-23 NOTE — Patient Instructions (Signed)

## 2015-09-23 NOTE — Progress Notes (Signed)
Pt presents with elevated BP.  Pt denies headaches or blurred vision.  Pt also states that her BP goes up and down all the time.  Pt denies having a PCP; contact info given for [facility].  [place] scheduled for February 15th @ 0800.  Pt notified.

## 2015-09-23 NOTE — Progress Notes (Signed)
Patient ID: Stacey Reynolds, female   DOB: Jan 23, 1991, 25 y.o.   MRN: [id number] History:  25 y.o. No obstetric history on file. G0  Pt is not attempting to conceive.  Here today for eval of AUB. Pt started her menses in late NOV and continued to [facility].  Pt has had irreg for years.  Was started on OCPs when in HS due to irreg cycles.  The cycles were reg on OCP's but, after 1 year pt stopped.  Cycles took awhile to go back to abnormal.  4-5 months after OCPs menses became irreg again.  Menses are usually 1 1/2 weeks. And the occur months. Pt was started on meds in the [facility]U and has not had bleeding since that time. Pt was transfused 4 units of blood on 08/28/2015.  Pt is still on FeSO4 and Megace.  Pt is not currently sexually active.  She denies dizziness or SOB.  The following portions of the patient's history were reviewed and updated as appropriate: allergies, current medications, past family history, past medical history, past social history, past surgical history and problem list.  Current Outpatient Prescriptions on File Prior to Visit  Medication Sig Dispense Refill  . ferrous sulfate 325 (65 FE) MG tablet Take 1 tablet (325 mg total) by mouth 2 (two) times daily with a meal. 60 tablet 3  . ibuprofen (ADVIL,MOTRIN) 200 MG tablet Take 600 mg by mouth every 6 (six) hours as needed for headache.    . megestrol (MEGACE) 40 MG tablet Take 1 tablet (40 mg total) by mouth 2 (two) times daily. 30 tablet 3   No current facility-administered medications on file prior to visit.    Review of Systems:  Pertinent items are noted in HPI.  Objective:  Physical Exam Blood pressure 151/105, pulse 101, temperature 98.6 F (37 C), temperature source Oral, height  (1.676 m), weight 403 lb 6.4 oz (182.981 kg), last menstrual period 08/28/2015. Gen: NAD Exam deferred   Labs and Imaging CBC    Component Value Date/Time   WBC 15.3* 08/29/2015 0452   RBC 3.95 08/29/2015 0452   HGB 7.7* 08/29/2015  0452   HCT 26.0* 08/29/2015 0452   PLT 486* 08/29/2015 0452   MCV 65.8* 08/29/2015 0452   MCH 19.5* 08/29/2015 0452   MCHC 29.6* 08/29/2015 0452   RDW 29.1* 08/29/2015 0452   LYMPHSABS 3.7 08/28/2015 2156   MONOABS 0.7 08/28/2015 2156   EOSABS 0.2 08/28/2015 2156   BASOSABS 0.0 08/28/2015 2156       Assessment & Plan:  AUB Elevated BP LoOvral 1 po q day Keep Megace  for 1 week and then discontinue Referral to primary care for eval of BP F/u in 4 months for LnIUD Labs today: CBC and TSH Pelvic sono   15 min spent with pt and her mother in face to face conversation  Eber Jones L. Harraway-Smith, M.D., [facility]

## 2015-09-30 ENCOUNTER — Ambulatory Visit (HOSPITAL_COMMUNITY): Payer: PRIVATE HEALTH INSURANCE

## 2015-10-07 ENCOUNTER — Ambulatory Visit (HOSPITAL_COMMUNITY): Payer: PRIVATE HEALTH INSURANCE

## 2015-12-24 ENCOUNTER — Emergency Department (HOSPITAL_BASED_OUTPATIENT_CLINIC_OR_DEPARTMENT_OTHER)
Admission: EM | Admit: 2015-12-24 | Discharge: 2015-12-24 | Disposition: A | Discharge status: Home / Self Care | Payer: No Typology Code available for payment source | Source: Home / Self Care | Attending: Emergency Medicine | Admitting: Emergency Medicine

## 2015-12-24 ENCOUNTER — Emergency Department (HOSPITAL_COMMUNITY)
Admission: EM | Admit: 2015-12-24 | Discharge: 2015-12-24 | Disposition: A | Discharge status: Home / Self Care | Payer: No Typology Code available for payment source | Attending: Emergency Medicine | Admitting: Emergency Medicine

## 2015-12-24 ENCOUNTER — Encounter (HOSPITAL_COMMUNITY): Payer: Self-pay | Admitting: Emergency Medicine

## 2015-12-24 DIAGNOSIS — Z793 Long term (current) use of hormonal contraceptives: Secondary | ICD-10-CM | POA: Diagnosis not present

## 2015-12-24 DIAGNOSIS — M7989 Other specified soft tissue disorders: Secondary | ICD-10-CM | POA: Diagnosis not present

## 2015-12-24 DIAGNOSIS — R600 Localized edema: Secondary | ICD-10-CM | POA: Diagnosis not present

## 2015-12-24 DIAGNOSIS — Z7982 Long term (current) use of aspirin: Secondary | ICD-10-CM | POA: Insufficient documentation

## 2015-12-24 DIAGNOSIS — I159 Secondary hypertension, unspecified: Secondary | ICD-10-CM | POA: Diagnosis not present

## 2015-12-24 DIAGNOSIS — Z79899 Other long term (current) drug therapy: Secondary | ICD-10-CM | POA: Diagnosis not present

## 2015-12-24 DIAGNOSIS — E669 Obesity, unspecified: Secondary | ICD-10-CM | POA: Insufficient documentation

## 2015-12-24 DIAGNOSIS — R609 Edema, unspecified: Secondary | ICD-10-CM

## 2015-12-24 LAB — URINE MICROSCOPIC-ADD ON

## 2015-12-24 LAB — URINALYSIS, ROUTINE W REFLEX MICROSCOPIC
Glucose, UA: NEGATIVE mg/dL
Ketones, ur: 15 mg/dL — AB
Nitrite: POSITIVE — AB
Protein, ur: 100 mg/dL — AB
Specific Gravity, Urine: 1.017 (ref 1.005–1.030)
pH: 5.5 (ref 5.0–8.0)

## 2015-12-24 LAB — CBC WITH DIFFERENTIAL/PLATELET
Basophils Absolute: 0 10*3/uL (ref 0.0–0.1)
Basophils Relative: 0 %
Eosinophils Absolute: 0.3 10*3/uL (ref 0.0–0.7)
Eosinophils Relative: 4 %
HCT: 35 % — ABNORMAL LOW (ref 36.0–46.0)
Hemoglobin: 11.3 g/dL — ABNORMAL LOW (ref 12.0–15.0)
Lymphocytes Relative: 27 %
Lymphs Abs: 2.3 10*3/uL (ref 0.7–4.0)
MCH: 26.5 pg (ref 26.0–34.0)
MCHC: 32.3 g/dL (ref 30.0–36.0)
MCV: 82 fL (ref 78.0–100.0)
Monocytes Absolute: 0.5 10*3/uL (ref 0.1–1.0)
Monocytes Relative: 6 %
Neutro Abs: 5.2 10*3/uL (ref 1.7–7.7)
Neutrophils Relative %: 63 %
Platelets: 472 10*3/uL — ABNORMAL HIGH (ref 150–400)
RBC: 4.27 MIL/uL (ref 3.87–5.11)
RDW: 15.8 % — ABNORMAL HIGH (ref 11.5–15.5)
WBC: 8.3 10*3/uL (ref 4.0–10.5)

## 2015-12-24 LAB — COMPREHENSIVE METABOLIC PANEL
ALT: 113 U/L — ABNORMAL HIGH (ref 14–54)
AST: 59 U/L — ABNORMAL HIGH (ref 15–41)
Albumin: 3.9 g/dL (ref 3.5–5.0)
Alkaline Phosphatase: 53 U/L (ref 38–126)
Anion gap: 8 (ref 5–15)
BUN: 9 mg/dL (ref 6–20)
CO2: 23 mmol/L (ref 22–32)
Calcium: 9.1 mg/dL (ref 8.9–10.3)
Chloride: 109 mmol/L (ref 101–111)
Creatinine, Ser: 0.79 mg/dL (ref 0.44–1.00)
GFR calc Af Amer: >60 mL/min (ref >60)
GFR calc non Af Amer: >60 mL/min (ref >60)
Glucose, Bld: 102 mg/dL — ABNORMAL HIGH (ref 65–99)
Potassium: 3.9 mmol/L (ref 3.5–5.1)
Sodium: 140 mmol/L (ref 135–145)
Total Bilirubin: 0.8 mg/dL (ref 0.3–1.2)
Total Protein: 7.4 g/dL (ref 6.5–8.1)

## 2015-12-24 LAB — PREGNANCY, URINE: Preg Test, Ur: NEGATIVE

## 2015-12-24 LAB — BRAIN NATRIURETIC PEPTIDE: B Natriuretic Peptide: 18 pg/mL (ref 0.0–100.0)

## 2015-12-24 MED ORDER — LISINOPRIL 10 MG PO TABS
10.0000 mg | ORAL_TABLET | Freq: Once | ORAL | Status: AC
  Administered 2015-12-24: 10 mg via ORAL
  Filled 2015-12-24 (×2): qty 1

## 2015-12-24 MED ORDER — LISINOPRIL 10 MG PO TABS: 10.0000 mg | ORAL_TABLET | Freq: Every day | ORAL | Status: DC

## 2015-12-24 NOTE — ED Notes (Signed)
Pt cannot use restroom at this time, aware urine specimen is needed.  

## 2015-12-24 NOTE — Progress Notes (Signed)
*  PRELIMINARY RESULTS* Vascular Ultrasound Lower extremity venous duplex has been completed.  Preliminary findings:  Technically limited due to body habitus. No obvious DVT noted in visualized veins.  Farrel DemarkJill Eunice, RDMS, RVT   12/24/2015, 12:18 PM

## 2015-12-24 NOTE — Discharge Instructions (Signed)
Edema °Edema is an abnormal buildup of fluids in your body tissues. Edema is somewhat dependent on gravity to pull the fluid to the lowest place in your body. That makes the condition more common in the legs and thighs (lower extremities). Painless swelling of the feet and ankles is common and becomes more likely as you get older. It is also common in looser tissues, like around your eyes.  °When the affected area is squeezed, the fluid may move out of that spot and leave a dent for a few moments. This dent is called pitting.  °CAUSES  °There are many possible causes of edema. Eating too much salt and being on your feet or sitting for a long time can cause edema in your legs and ankles. Hot weather may make edema worse. Common medical causes of edema include: °· Heart failure. °· Liver disease. °· Kidney disease. °· Weak blood vessels in your legs. °· Cancer. °· An injury. °· Pregnancy. °· Some medications. °· Obesity.  °SYMPTOMS  °Edema is usually painless. Your skin may look swollen or shiny.  °DIAGNOSIS  °Your health care provider may be able to diagnose edema by asking about your medical history and doing a physical exam. You may need to have tests such as X-rays, an electrocardiogram, or blood tests to check for medical conditions that may cause edema.  °TREATMENT  °Edema treatment depends on the cause. If you have heart, liver, or kidney disease, you need the treatment appropriate for these conditions. General treatment may include: °· Elevation of the affected body part above the level of your heart. °· Compression of the affected body part. Pressure from elastic bandages or support stockings squeezes the tissues and forces fluid back into the blood vessels. This keeps fluid from entering the tissues. °· Restriction of fluid and salt intake. °· Use of a water pill (diuretic). These medications are appropriate only for some types of edema. They pull fluid out of your body and make you urinate more often. This  gets rid of fluid and reduces swelling, but diuretics can have side effects. Only use diuretics as directed by your health care provider. °HOME CARE INSTRUCTIONS  °· Keep the affected body part above the level of your heart when you are lying down.   °· Do not sit still or stand for prolonged periods.   °· Do not put anything directly under your knees when lying down. °· Do not wear constricting clothing or garters on your upper legs.   °· Exercise your legs to work the fluid back into your blood vessels. This may help the swelling go down.   °· Wear elastic bandages or support stockings to reduce ankle swelling as directed by your health care provider.   °· Eat a low-salt diet to reduce fluid if your health care provider recommends it.   °· Only take medicines as directed by your health care provider.  °SEEK MEDICAL CARE IF:  °· Your edema is not responding to treatment. °· You have heart, liver, or kidney disease and notice symptoms of edema. °· You have edema in your legs that does not improve after elevating them.   °· You have sudden and unexplained weight gain. °SEEK IMMEDIATE MEDICAL CARE IF:  °· You develop shortness of breath or chest pain.   °· You cannot breathe when you lie down. °· You develop pain, redness, or warmth in the swollen areas.   °· You have heart, liver, or kidney disease and suddenly get edema. °· You have a fever and your symptoms suddenly get worse. °MAKE SURE YOU:  °·   Understand these instructions. °· Will watch your condition. °· Will get help right away if you are not doing well or get worse. °  °This information is not intended to replace advice given to you by your health care provider. Make sure you discuss any questions you have with your health care provider. °  °Document Released: 08/01/2005 Document Revised: 08/22/2014 Document Reviewed: 05/24/2013 °Elsevier Interactive Patient Education ©2016 [employer] ° °Hypertension °Hypertension, commonly called high blood pressure, is  when the force of blood pumping through your arteries is too strong. Your arteries are the blood vessels that carry blood from your heart throughout your body. A blood pressure reading consists of a higher number over a lower number, such as 110/72. The higher number (systolic) is the pressure inside your arteries when your heart pumps. The lower number (diastolic) is the pressure inside your arteries when your heart relaxes. Ideally you want your blood pressure below 120/80. °Hypertension forces your heart to work harder to pump blood. Your arteries may become narrow or stiff. Having untreated or uncontrolled hypertension can cause heart attack, stroke, kidney disease, and other problems. °RISK FACTORS °Some risk factors for high blood pressure are controllable. Others are not.  °Risk factors you cannot control include:  °· Race. You may be at higher risk if you are African American. °· Age. Risk increases with age. °· Gender. Men are at higher risk than women before age 45 years. After age 65, women are at higher risk than men. °Risk factors you can control include: °· Not getting enough exercise or physical activity. °· Being overweight. °· Getting too much fat, sugar, calories, or salt in your diet. °· Drinking too much alcohol. °SIGNS AND SYMPTOMS °Hypertension does not usually cause signs or symptoms. Extremely high blood pressure (hypertensive crisis) may cause headache, anxiety, shortness of breath, and nosebleed. °DIAGNOSIS °To check if you have hypertension, your health care provider will measure your blood pressure while you are seated, with your arm held at the level of your heart. It should be measured at least twice using the same arm. Certain conditions can cause a difference in blood pressure between your right and left arms. A blood pressure reading that is higher than normal on one occasion does not mean that you need treatment. If it is not clear whether you have high blood pressure, you may be  asked to return on a different day to have your blood pressure checked again. Or, you may be asked to monitor your blood pressure at home for 1 or more weeks. °TREATMENT °Treating high blood pressure includes making lifestyle changes and possibly taking medicine. Living a healthy lifestyle can help lower high blood pressure. You may need to change some of your habits. °Lifestyle changes may include: °· Following the DASH diet. This diet is high in fruits, vegetables, and whole grains. It is low in salt, red meat, and added sugars. °· Keep your sodium intake below 2,300 mg per day. °· Getting at least 30-45 minutes of aerobic exercise at least 4 times per week. °· Losing weight if necessary. °· Not smoking. °· Limiting alcoholic beverages. °· Learning ways to reduce stress. °Your health care provider may prescribe medicine if lifestyle changes are not enough to get your blood pressure under control, and if one of the following is true: °· You are 18-59 years of age and your systolic blood pressure is above 140. °· You are 60 years of age or older, and your systolic blood pressure is   above 150. °· Your diastolic blood pressure is above 90. °· You have diabetes, and your systolic blood pressure is over 140 or your diastolic blood pressure is over 90. °· You have kidney disease and your blood pressure is above 140/90. °· You have heart disease and your blood pressure is above 140/90. °Your personal target blood pressure may vary depending on your medical conditions, your age, and other factors. °HOME CARE INSTRUCTIONS °· Have your blood pressure rechecked as directed by your health care provider.   °· Take medicines only as directed by your health care provider. Follow the directions carefully. Blood pressure medicines must be taken as prescribed. The medicine does not work as well when you skip doses. Skipping doses also puts you at risk for problems. °· Do not smoke.   °· Monitor your blood pressure at home as  directed by your health care provider.  °SEEK MEDICAL CARE IF:  °· You think you are having a reaction to medicines taken. °· You have recurrent headaches or feel dizzy. °· You have swelling in your ankles. °· You have trouble with your vision. °SEEK IMMEDIATE MEDICAL CARE IF: °· You develop a severe headache or confusion. °· You have unusual weakness, numbness, or feel faint. °· You have severe chest or abdominal pain. °· You vomit repeatedly. °· You have trouble breathing. °MAKE SURE YOU:  °· Understand these instructions. °· Will watch your condition. °· Will get help right away if you are not doing well or get worse. °  °This information is not intended to replace advice given to you by your health care provider. Make sure you discuss any questions you have with your health care provider. °  °Document Released: 08/01/2005 Document Revised: 12/16/2014 Document Reviewed: 05/24/2013 °Elsevier Interactive Patient Education ©2016 [employer] Inc. ° °

## 2015-12-24 NOTE — ED Provider Notes (Signed)
CSN: [id number]     Arrival date & time 12/24/15  1012 History   First MD Initiated Contact with Patient 12/24/15 1053     Chief Complaint  Patient presents with  . Leg Swelling     (Consider location/radiation/quality/duration/timing/severity/associated sxs/prior Treatment) HPI Patient presents with bilateral lower extremity swelling for the last 2 weeks. She states the swelling has been worse in the left leg. Swelling is intermittent. Generally improved in the mornings. She denies any shortness of breath, dyspnea with exertion or orthopnea. She's had no fever or chills. No chest pain. Patient received 4 units packed red blood cells earlier this year. Anemia due to excessive vaginal bleeding. Started on Megace for 1 month and then transition to oral birth control pills. She states her periods have been regular. Denies lightheadedness or dizziness with standing. No generalized weakness. Past Medical History  Diagnosis Date  . Obesity    Past Surgical History  Procedure Laterality Date  . Tonsillectomy     Family History  Problem Relation Age of Onset  . Cancer Father    Social History  Substance Use Topics  . Smoking status: Never Smoker   . Smokeless tobacco: Never Used  . Alcohol Use: Yes     Comment: occasionally   OB History    No data available     Review of Systems  Constitutional: Negative for fever, chills and fatigue.  Respiratory: Negative for cough and shortness of breath.   Cardiovascular: Positive for leg swelling. Negative for chest pain and palpitations.  Gastrointestinal: Negative for nausea, vomiting, abdominal pain and diarrhea.  Genitourinary: Negative for vaginal bleeding.  Musculoskeletal: Negative for myalgias, back pain, neck pain and neck stiffness.  Skin: Negative for rash and wound.  Neurological: Negative for dizziness, weakness, light-headedness, numbness and headaches.  All other systems reviewed and are negative.     Allergies  Review of  patient's allergies indicates no known allergies.  Home Medications   Prior to Admission medications   Medication Sig Start Date End Date Taking? Authorizing Provider  Aspirin-Caffeine (BAYER BACK & BODY) 500-32.5 MG TABS Take 1 tablet by mouth 2 (two) times daily as needed (Pain).   Yes Historical Provider, MD  norgestrel-ethinyl estradiol (LO/OVRAL,CRYSELLE) 0.3-30 MG-MCG tablet Take 1 tablet by mouth daily. 09/23/15  Yes Willodean Rosenthal, MD  ferrous sulfate 325 (65 FE) MG tablet Take 1 tablet (325 mg total) by mouth 2 (two) times daily with a meal. Patient not taking: Reported on 12/24/2015 08/29/15   Adam Phenix, MD  lisinopril (PRINIVIL,ZESTRIL) 10 MG tablet Take 1 tablet (10 mg total) by mouth daily. 12/24/15   Loren Racer, MD  megestrol (MEGACE) 40 MG tablet Take 1 tablet (40 mg total) by mouth 2 (two) times daily. Patient not taking: Reported on 12/24/2015 08/29/15   Adam Phenix, MD   BP 177/113 mmHg  Pulse 79  Temp(Src) 97.9 F (36.6 C) (Oral)  Resp 18  SpO2 100%  LMP 12/24/2015 Physical Exam  Constitutional: She is oriented to person, place, and time. She appears well-developed and well-nourished. No distress.  HENT:  Head: Normocephalic and atraumatic.  Mouth/Throat: Oropharynx is clear and moist. No oropharyngeal exudate.  Eyes: EOM are normal. Pupils are equal, round, and reactive to light.  Neck: Normal range of motion. Neck supple.  Cardiovascular: Normal rate and regular rhythm.   Pulmonary/Chest: Effort normal and breath sounds normal. No respiratory distress. She has no wheezes. She has no rales. She exhibits no tenderness.  Abdominal: Soft.  Bowel sounds are normal. She exhibits no distension and no mass. There is no tenderness. There is no rebound and no guarding.  Musculoskeletal: Normal range of motion. She exhibits edema. She exhibits no tenderness.  Left greater than right lower extremity swelling especially in the calf area. Patient has 1+ pitting  edema of the bilateral pretibial areas. No tenderness with palpation. No erythema or warmth. Distal pulses are equal  Neurological: She is alert and oriented to person, place, and time.  5/5 motor OR bruits. Sensation is fully intact.  Skin: Skin is warm and dry. No rash noted. No erythema.  Psychiatric: She has a normal mood and affect. Her behavior is normal.  Nursing note and vitals reviewed.   ED Course  Procedures (including critical care time) Labs Review Labs Reviewed  CBC WITH DIFFERENTIAL/PLATELET - Abnormal; Notable for the following:    Hemoglobin 11.3 (*)    HCT 35.0 (*)    RDW 15.8 (*)    Platelets 472 (*)    All other components within normal limits  COMPREHENSIVE METABOLIC PANEL - Abnormal; Notable for the following:    Glucose, Bld 102 (*)    AST 59 (*)    ALT 113 (*)    All other components within normal limits  URINALYSIS, ROUTINE W REFLEX MICROSCOPIC (NOT AT [facility]) - Abnormal; Notable for the following:    Color, Urine RED (*)    APPearance TURBID (*)    Hgb urine dipstick LARGE (*)    Bilirubin Urine MODERATE (*)    Ketones, ur 15 (*)    Protein, ur 100 (*)    Nitrite POSITIVE (*)    Leukocytes, UA MODERATE (*)    All other components within normal limits  URINE MICROSCOPIC-ADD ON - Abnormal; Notable for the following:    Squamous Epithelial / LPF 0-5 (*)    Bacteria, UA FEW (*)    All other components within normal limits  BRAIN NATRIURETIC PEPTIDE  PREGNANCY, URINE    Imaging Review No results found. I have personally reviewed and evaluated these images and lab results as part of my medical decision-making.   EKG Interpretation None      MDM   Final diagnoses:  Peripheral edema  Secondary hypertension, unspecified    Patient's son have a history of hypertension. States her blood pressures been elevated since receiving blood transfusion.  Patient states she is on her period. Denies any urinary symptoms. Doppler ultrasound without evidence  of DVT. Patient states she sits for 10 hours a day at her job. Advised to keep legs elevated. Also patient should be wearing compression stockings. Patient is significantly elevated blood pressure department. Will start on low-dose lisinopril. She does not have a primary physician. Have advised follow-[facility] to manage blood pressure. She's been given return precautions and has voiced  understanding.  Loren Raceravid Ashara Lounsbury, MD 12/24/15 1343

## 2015-12-24 NOTE — ED Notes (Addendum)
Per pt, states B/L ankle/foot swelling for 2 weeks-states she was placed on new birth control-no difficulty breathing-states she was admitted and received 4 units of blood in January due to vaginal bleeding

## 2016-06-25 ENCOUNTER — Ambulatory Visit (HOSPITAL_COMMUNITY)
Admission: EM | Admit: 2016-06-25 | Discharge: 2016-06-25 | Disposition: A | Discharge status: Home / Self Care | Payer: Self-pay | Attending: Family Medicine | Admitting: Family Medicine

## 2016-06-25 ENCOUNTER — Encounter (HOSPITAL_COMMUNITY): Payer: Self-pay | Admitting: Family Medicine

## 2016-06-25 DIAGNOSIS — N3000 Acute cystitis without hematuria: Secondary | ICD-10-CM

## 2016-06-25 HISTORY — DX: Essential (primary) hypertension: I10

## 2016-06-25 LAB — POCT URINALYSIS DIP (DEVICE)
Bilirubin Urine: NEGATIVE
Glucose, UA: NEGATIVE mg/dL
Ketones, ur: NEGATIVE mg/dL
Nitrite: NEGATIVE
Protein, ur: 100 mg/dL — AB
Specific Gravity, Urine: 1.015 (ref 1.005–1.030)
Urobilinogen, UA: 0.2 mg/dL (ref 0.0–1.0)
pH: 7 (ref 5.0–8.0)

## 2016-06-25 LAB — POCT PREGNANCY, URINE: Preg Test, Ur: NEGATIVE

## 2016-06-25 MED ORDER — SULFAMETHOXAZOLE-TRIMETHOPRIM 800-160 MG PO TABS: 1.0000 | ORAL_TABLET | Freq: Two times a day (BID) | ORAL | 0 refills | Status: DC

## 2016-06-25 NOTE — Discharge Instructions (Signed)
Your blood pressure was mildly elevated today. Need to have this rechecked by your personal doctor.

## 2016-06-25 NOTE — ED Provider Notes (Signed)
[facility]    CSN: [id number] Arrival date & time: 06/25/16  1201     History   Chief Complaint Chief Complaint  Patient presents with  . Dysuria    HPI Stacey Reynolds is a 25 y.o. female.   This is a 25 year old woman who works at [employer]. She comes in with 2 days of frequency and dysuria. She seen no blood, has no flank pain, and has no fever. She does have some midline low back pain, however. There is no history of recurrent UTI.      Past Medical History:  Diagnosis Date  . Hypertension   . Obesity     Patient Active Problem List   Diagnosis Date Noted  . DUB (dysfunctional uterine bleeding) 09/23/2015  . Anemia due to blood loss, chronic 08/29/2015    Past Surgical History:  Procedure Laterality Date  . TONSILLECTOMY      OB History    No data available       Home Medications    Prior to Admission medications   Medication Sig Start Date End Date Taking? Authorizing Provider  Aspirin-Caffeine (BAYER BACK & BODY) 500-32.5 MG TABS Take 1 tablet by mouth 2 (two) times daily as needed (Pain).   Yes Historical Provider, MD  norgestrel-ethinyl estradiol (LO/OVRAL,CRYSELLE) 0.3-30 MG-MCG tablet Take 1 tablet by mouth daily. 09/23/15  Yes Willodean Rosenthalarolyn Harraway-Smith, MD  sulfamethoxazole-trimethoprim (BACTRIM DS,SEPTRA DS) 800-160 MG tablet Take 1 tablet by mouth 2 (two) times daily. 06/25/16   Elvina SidleKurt Hakim Minniefield, MD    Family History Family History  Problem Relation Age of Onset  . Cancer Father     Social History Social History  Substance Use Topics  . Smoking status: Never Smoker  . Smokeless tobacco: Never Used  . Alcohol use Yes     Comment: occasionally     Allergies   Patient has no known allergies.   Review of Systems Review of Systems  Constitutional: Negative.   HENT: Negative.   Eyes: Negative.   Respiratory: Negative.   Cardiovascular: Negative.   Gastrointestinal: Negative.   Genitourinary: Positive for  dysuria, frequency and urgency.     Physical Exam Triage Vital Signs ED Triage Vitals  Enc Vitals Group     BP      Pulse      Resp      Temp      Temp src      SpO2      Weight      Height      Head Circumference      Peak Flow      Pain Score      Pain Loc      Pain Edu?      Excl. in GC?    No data found.   Updated Vital Signs BP (!) 166/106   Pulse 113   Temp 98.3 F (36.8 C) (Oral)   Resp 20   LMP 05/29/2016 (Approximate)   SpO2 100%    Physical Exam  Constitutional: She is oriented to person, place, and time. She appears well-developed and well-nourished.  HENT:  Head: Normocephalic.  Right Ear: External ear normal.  Left Ear: External ear normal.  Mouth/Throat: Oropharynx is clear and moist.  Eyes: Conjunctivae are normal. Pupils are equal, round, and reactive to light.  Neck: Normal range of motion. Neck supple.  Cardiovascular: Normal rate and regular rhythm.   Pulmonary/Chest: Effort normal.  Abdominal: Soft. There is no tenderness.  Musculoskeletal:  Normal range of motion.  Neurological: She is alert and oriented to person, place, and time.  Skin: Skin is warm and dry.     UC Treatments / Results  Labs (all labs ordered are listed, but only abnormal results are displayed) Labs Reviewed  POCT URINALYSIS DIP (DEVICE) - Abnormal; Notable for the following:       Result Value   Hgb urine dipstick MODERATE (*)    Protein, ur 100 (*)    Leukocytes, UA LARGE (*)    All other components within normal limits  POCT PREGNANCY, URINE    EKG  EKG Interpretation None       Radiology No results found.  Procedures Procedures (including critical care time)  Medications Ordered in UC Medications - No data to display   Initial Impression / Assessment and Plan / UC Course  I have reviewed the triage vital signs and the nursing notes.  Pertinent labs & imaging results that were available during my care of the patient were reviewed by me and  considered in my medical decision making (see chart for details).  Clinical Course      Final Clinical Impressions(s) / UC Diagnoses   Final diagnoses:  Acute cystitis without hematuria    New Prescriptions New Prescriptions   SULFAMETHOXAZOLE-TRIMETHOPRIM (BACTRIM DS,SEPTRA DS) 800-160 MG TABLET    Take 1 tablet by mouth 2 (two) times daily.     Elvina SidleKurt Seona Clemenson, MD 06/25/16 1318

## 2016-06-25 NOTE — ED Triage Notes (Signed)
Started with dysuria, low back pain, low abd pain, and polyuria 2 days ago without fever.

## 2016-09-21 ENCOUNTER — Other Ambulatory Visit: Payer: Self-pay

## 2016-09-21 DIAGNOSIS — N938 Other specified abnormal uterine and vaginal bleeding: Secondary | ICD-10-CM

## 2016-09-21 MED ORDER — NORGESTREL-ETHINYL ESTRADIOL 0.3-30 MG-MCG PO TABS: 1.0000 | ORAL_TABLET | Freq: Every day | ORAL | 3 refills | Status: DC

## 2016-09-21 NOTE — Telephone Encounter (Signed)
Patient called requesting a refill on birth control pills. She was given 3 refills.

## 2016-11-06 ENCOUNTER — Encounter (HOSPITAL_COMMUNITY): Payer: Self-pay | Admitting: *Deleted

## 2016-11-06 ENCOUNTER — Ambulatory Visit (HOSPITAL_COMMUNITY)
Admission: EM | Admit: 2016-11-06 | Discharge: 2016-11-06 | Disposition: A | Discharge status: Home / Self Care | Payer: BLUE CROSS/BLUE SHIELD | Attending: Family Medicine | Admitting: Family Medicine

## 2016-11-06 DIAGNOSIS — N3001 Acute cystitis with hematuria: Secondary | ICD-10-CM

## 2016-11-06 DIAGNOSIS — Z3202 Encounter for pregnancy test, result negative: Secondary | ICD-10-CM | POA: Diagnosis not present

## 2016-11-06 DIAGNOSIS — Z1389 Encounter for screening for other disorder: Secondary | ICD-10-CM

## 2016-11-06 LAB — POCT URINALYSIS DIP (DEVICE)
Bilirubin Urine: NEGATIVE
Glucose, UA: NEGATIVE mg/dL
Ketones, ur: NEGATIVE mg/dL
Nitrite: NEGATIVE
Protein, ur: 100 mg/dL — AB
Specific Gravity, Urine: 1.015 (ref 1.005–1.030)
Urobilinogen, UA: 0.2 mg/dL (ref 0.0–1.0)
pH: 7 (ref 5.0–8.0)

## 2016-11-06 LAB — POCT PREGNANCY, URINE: Preg Test, Ur: NEGATIVE

## 2016-11-06 MED ORDER — PHENAZOPYRIDINE HCL 200 MG PO TABS: 200.0000 mg | ORAL_TABLET | Freq: Three times a day (TID) | ORAL | 0 refills | Status: DC

## 2016-11-06 MED ORDER — CEPHALEXIN 500 MG PO CAPS: 500.0000 mg | ORAL_CAPSULE | Freq: Three times a day (TID) | ORAL | 0 refills | Status: DC

## 2016-11-06 NOTE — ED Provider Notes (Signed)
CSN: [id number]     Arrival date & time 11/06/16  1944 History   None    Chief Complaint  Patient presents with  . Dysuria  . Back Pain   (Consider location/radiation/quality/duration/timing/severity/associated sxs/prior Treatment)  HPI   She is a 26 year old female presenting today with complaints of discomfort with urination for approximately 6 days. Patient states she was going wait until tomorrow to see her primary care provider but developed some lower back pain today which concerned her. Patient states that she has eating but no frequency or hematuria noted. States has had a recent UTI in the past 6 months.  Past Medical History:  Diagnosis Date  . Hypertension   . Obesity    Past Surgical History:  Procedure Laterality Date  . TONSILLECTOMY     Family History  Problem Relation Age of Onset  . Cancer Father    Social History  Substance Use Topics  . Smoking status: Never Smoker  . Smokeless tobacco: Never Used  . Alcohol use Yes     Comment: occasionally   OB History    No data available     Review of Systems  Constitutional: Negative for chills and fever.  Gastrointestinal: Negative for abdominal pain, nausea and vomiting.  Genitourinary: Positive for flank pain. Negative for decreased urine volume, difficulty urinating, frequency, genital sores, hematuria, urgency, vaginal bleeding, vaginal discharge and vaginal pain.       Burning with urination.    Allergies  Patient has no known allergies.  Home Medications   Prior to Admission medications   Medication Sig Start Date End Date Taking? Authorizing Provider  Aspirin-Caffeine (BAYER BACK & BODY) 500-32.5 MG TABS Take 1 tablet by mouth 2 (two) times daily as needed (Pain).    Historical Provider, MD  cephALEXin (KEFLEX) 500 MG capsule Take 1 capsule (500 mg total) by mouth 3 (three) times daily. 11/06/16   Servando Salina, NP  norgestrel-ethinyl estradiol (LO/OVRAL,CRYSELLE) 0.3-30 MG-MCG tablet Take 1  tablet by mouth daily. 09/21/16   Willodean Rosenthal, MD  phenazopyridine (PYRIDIUM) 200 MG tablet Take 1 tablet (200 mg total) by mouth 3 (three) times daily. 11/06/16   Servando Salina, NP  sulfamethoxazole-trimethoprim (BACTRIM DS,SEPTRA DS) 800-160 MG tablet Take 1 tablet by mouth 2 (two) times daily. 06/25/16   Elvina Sidle, MD   Meds Ordered and Administered this Visit  Medications - No data to display  BP (!) 193/107 Comment: States BP "always high when I go to the dr"  Pulse 87   Temp 98.2 F (36.8 C) (Oral)   Resp (!) 22   LMP 10/24/2016 (Approximate)   SpO2 100%  No data found.   Physical Exam  Constitutional: She appears well-developed and well-nourished. No distress.  Cardiovascular: Normal rate, regular rhythm, normal heart sounds and intact distal pulses.  Exam reveals no gallop and no friction rub.   No murmur heard. Pulmonary/Chest: Effort normal and breath sounds normal. No respiratory distress. She has no wheezes. She has no rales. She exhibits no tenderness.  Abdominal: Soft. Bowel sounds are normal. She exhibits no distension and no mass. There is no tenderness. There is no rebound and no guarding. No hernia.  Negative CVA tenderness.  Skin: She is not diaphoretic.  Nursing note and vitals reviewed.  Reports history of white coat hypertension states this is not her normal blood pressure denies symptomology.  Urgent Care Course     Procedures (including critical care time)  Labs Review Labs Reviewed  POCT  URINALYSIS DIP (DEVICE) - Abnormal; Notable for the following:       Result Value   Hgb urine dipstick LARGE (*)    Protein, ur 100 (*)    Leukocytes, UA LARGE (*)    All other components within normal limits  POCT PREGNANCY, URINE   Results for orders placed or performed during the hospital encounter of 11/06/16  POCT urinalysis dip (device)  Result Value Ref Range   Glucose, UA NEGATIVE NEGATIVE mg/dL   Bilirubin Urine NEGATIVE NEGATIVE    Ketones, ur NEGATIVE NEGATIVE mg/dL   Specific Gravity, Urine 1.015 1.005 - 1.030   Hgb urine dipstick LARGE (A) NEGATIVE   pH 7.0 5.0 - 8.0   Protein, ur 100 (A) NEGATIVE mg/dL   Urobilinogen, UA 0.2 0.0 - 1.0 mg/dL   Nitrite NEGATIVE NEGATIVE   Leukocytes, UA LARGE (A) NEGATIVE  Pregnancy, urine POC  Result Value Ref Range   Preg Test, Ur NEGATIVE NEGATIVE   Discussed good hygiene practices for prevention of UTI symptoms in the future.  Imaging Review No results found.   MDM   1. Acute cystitis with hematuria    Meds ordered this encounter  Medications  . cephALEXin (KEFLEX) 500 MG capsule    Sig: Take 1 capsule (500 mg total) by mouth 3 (three) times daily.    Dispense:  30 capsule    Refill:  0  . phenazopyridine (PYRIDIUM) 200 MG tablet    Sig: Take 1 tablet (200 mg total) by mouth 3 (three) times daily.    Dispense:  6 tablet    Refill:  0   The usual and customary discharge instructions and warnings were given.  The patient verbalizes understanding and agrees to plan of care.       Servando Salinaatherine H Rossi, NP 11/06/16 [phone]

## 2016-11-06 NOTE — Discharge Instructions (Signed)
Use a probiotic with lactobacillus or yogurt daily to help maintain good vaginal health.

## 2016-11-06 NOTE — ED Triage Notes (Signed)
C/O dysuria and low back pain x 1 wk without fevers.

## 2017-01-16 ENCOUNTER — Ambulatory Visit (INDEPENDENT_AMBULATORY_CARE_PROVIDER_SITE_OTHER): Payer: BLUE CROSS/BLUE SHIELD | Admitting: Obstetrics & Gynecology

## 2017-01-16 ENCOUNTER — Encounter: Payer: Self-pay | Admitting: Obstetrics & Gynecology

## 2017-01-16 VITALS — BP 151/107 | HR 89 | Ht 66.0 in | Wt >= 6400 oz

## 2017-01-16 DIAGNOSIS — Z113 Encounter for screening for infections with a predominantly sexual mode of transmission: Secondary | ICD-10-CM

## 2017-01-16 DIAGNOSIS — Z3009 Encounter for other general counseling and advice on contraception: Secondary | ICD-10-CM

## 2017-01-16 DIAGNOSIS — Z124 Encounter for screening for malignant neoplasm of cervix: Secondary | ICD-10-CM | POA: Diagnosis not present

## 2017-01-16 DIAGNOSIS — R030 Elevated blood-pressure reading, without diagnosis of hypertension: Secondary | ICD-10-CM

## 2017-01-16 DIAGNOSIS — Z01419 Encounter for gynecological examination (general) (routine) without abnormal findings: Secondary | ICD-10-CM

## 2017-01-16 DIAGNOSIS — N938 Other specified abnormal uterine and vaginal bleeding: Secondary | ICD-10-CM

## 2017-01-16 LAB — POCT PREGNANCY, URINE: Preg Test, Ur: NEGATIVE

## 2017-01-16 MED ORDER — NORGESTREL-ETHINYL ESTRADIOL 0.3-30 MG-MCG PO TABS: 1.0000 | ORAL_TABLET | Freq: Every day | ORAL | 12 refills | Status: DC

## 2017-01-16 NOTE — Patient Instructions (Signed)
Exercising to Lose Weight Exercising can help you to lose weight. In order to lose weight through exercise, you need to do vigorous-intensity exercise. You can tell that you are exercising with vigorous intensity if you are breathing very hard and fast and cannot hold a conversation while exercising. Moderate-intensity exercise helps to maintain your current weight. You can tell that you are exercising at a moderate level if you have a higher heart rate and faster breathing, but you are still able to hold a conversation. How often should I exercise? Choose an activity that you enjoy and set realistic goals. Your health care provider can help you to make an activity plan that works for you. Exercise regularly as directed by your health care provider. This may include:  Doing resistance training twice each week, such as: ? Push-ups. ? Sit-ups. ? Lifting weights. ? Using resistance bands.  Doing a given intensity of exercise for a given amount of time. Choose from these options: ? 150 minutes of moderate-intensity exercise every week. ? 75 minutes of vigorous-intensity exercise every week. ? A mix of moderate-intensity and vigorous-intensity exercise every week.  Children, pregnant women, people who are out of shape, people who are overweight, and older adults may need to consult a health care provider for individual recommendations. If you have any sort of medical condition, be sure to consult your health care provider before starting a new exercise program. What are some activities that can help me to lose weight?  Walking at a rate of at least 4.5 miles an hour.  Jogging or running at a rate of 5 miles per hour.  Biking at a rate of at least 10 miles per hour.  Lap swimming.  Roller-skating or in-line skating.  Cross-country skiing.  Vigorous competitive sports, such as football, basketball, and soccer.  Jumping rope.  Aerobic dancing. How can I be more active in my day-to-day  activities?  Use the stairs instead of the elevator.  Take a walk during your lunch break.  If you drive, park your car farther away from work or school.  If you take public transportation, get off one stop early and walk the rest of the way.  Make all of your phone calls while standing up and walking around.  Get up, stretch, and walk around every 30 minutes throughout the day. What guidelines should I follow while exercising?  Do not exercise so much that you hurt yourself, feel dizzy, or get very short of breath.  Consult your health care provider prior to starting a new exercise program.  Wear comfortable clothes and shoes with good support.  Drink plenty of water while you exercise to prevent dehydration or heat stroke. Body water is lost during exercise and must be replaced.  Work out until you breathe faster and your heart beats faster. This information is not intended to replace advice given to you by your health care provider. Make sure you discuss any questions you have with your health care provider. Document Released: 09/03/2010 Document Revised: 01/07/2016 Document Reviewed: 01/02/2014 Elsevier Interactive Patient Education  2018 [employer]  

## 2017-01-16 NOTE — Progress Notes (Signed)
Subjective:     Stacey Reynolds is a 26 y.o. female here for a routine exam.  Current complaints: pt now reports monthly cycles. Took last pack of OCPs in Jan.  Pt has still been sexually active.  She is using condoms with all intercourse. No more heavy bleeding or missed cycles.  Pt has lost 20# since Mar.   Father just died of metastatic cancer of the brain, spine  Dx Oct 2016 died Feb 2017. Died of a PE   Gynecologic History Patient's last menstrual period was 01/11/2017 (exact date). Contraception: OCP (estrogen/progesterone) Last Pap: never had  Last mammogram: never had.   Obstetric History OB History  No data available    The following portions of the patient's history were reviewed and updated as appropriate: allergies, current medications, past family history, past medical history, past social history, past surgical history and problem list.  Review of Systems Pertinent items are noted in HPI.    Objective:   BP (!) 151/107   Pulse 89   Ht 5\' 6"  (1.676 m)   Wt (!) 415 lb (188.2 kg)   LMP 01/11/2017 (Exact Date)   BMI 66.98 kg/m  General Appearance:    Alert, cooperative, no distress, appears stated age  Head:    Normocephalic, without obvious abnormality, atraumatic  Eyes:    conjunctiva/corneas clear, EOM's intact, both eyes  Ears:    Normal external ear canals, both ears  Nose:   Nares normal, septum midline, mucosa normal, no drainage    or sinus tenderness  Throat:   Lips, mucosa, and tongue normal; teeth and gums normal  Neck:   Supple, symmetrical, trachea midline, no adenopathy;    thyroid:  no enlargement/tenderness/nodules  Back:     Symmetric, no curvature, ROM normal, no CVA tenderness  Lungs:     Clear to auscultation bilaterally, respirations unlabored  Chest Wall:    No tenderness or deformity   Heart:    Regular rate and rhythm, S1 and S2 normal, no murmur, rub   or gallop  Breast Exam:    No tenderness, masses, or nipple abnormality  Abdomen:      Soft, non-tender, bowel sounds active all four quadrants,    no masses, no organomegaly  Genitalia:    Normal female without lesion, discharge or tenderness- largest speculum used- cervix is very deep.     Extremities:   Extremities normal, atraumatic, no cyanosis or edema  Pulses:   2+ and symmetric all extremities  Skin:   Skin color, texture, turgor normal, no rashes or lesions     Assessment:    Healthy female exam.   Morbid obesity- reviewed with pt diet and exercise. She is trying to exercise and eat healthier STI screen AUB- improved now.  Contraception- wants to restart OCPs   Elevated BPs   Plan:    Follow up in: 1 year.    STI screen: HIV, RPR, Hep B & C F/u PAP and cx Exercise 6x/week Eating for weight loss Refilled LoOvral Refer to primary care Pt will check her BP with her grandmother BP cuff weekly   Valynn Schamberger L. Harraway-Smith, M.D., [facility]

## 2017-01-17 LAB — HIV ANTIBODY (ROUTINE TESTING W REFLEX): HIV Screen 4th Generation wRfx: NONREACTIVE

## 2017-01-17 LAB — CBC
Hematocrit: 36.5 % (ref 34.0–46.6)
Hemoglobin: 11.8 g/dL (ref 11.1–15.9)
MCH: 28.2 pg (ref 26.6–33.0)
MCHC: 32.3 g/dL (ref 31.5–35.7)
MCV: 87 fL (ref 79–97)
Platelets: 478 10*3/uL — ABNORMAL HIGH (ref 150–379)
RBC: 4.19 x10E6/uL (ref 3.77–5.28)
RDW: 15.7 % — ABNORMAL HIGH (ref 12.3–15.4)
WBC: 8.2 10*3/uL (ref 3.4–10.8)

## 2017-01-17 LAB — RPR: RPR Ser Ql: NONREACTIVE

## 2017-01-17 LAB — CYTOLOGY - PAP
Adequacy: ABSENT
Chlamydia: NEGATIVE
Diagnosis: NEGATIVE
Neisseria Gonorrhea: NEGATIVE
Trichomonas: NEGATIVE

## 2017-01-17 LAB — HEMOGLOBIN A1C
Est. average glucose Bld gHb Est-mCnc: 108 mg/dL
Hgb A1c MFr Bld: 5.4 % (ref 4.8–5.6)

## 2017-01-17 LAB — HEPATITIS C ANTIBODY: Hep C Virus Ab: 0.1 s/co ratio (ref 0.0–0.9)

## 2017-01-17 LAB — HEPATITIS B SURFACE ANTIGEN: Hepatitis B Surface Ag: NEGATIVE

## 2017-03-27 ENCOUNTER — Ambulatory Visit (HOSPITAL_COMMUNITY)
Admission: EM | Admit: 2017-03-27 | Discharge: 2017-03-27 | Disposition: A | Discharge status: Home / Self Care | Payer: BLUE CROSS/BLUE SHIELD | Attending: Family Medicine | Admitting: Family Medicine

## 2017-03-27 ENCOUNTER — Encounter (HOSPITAL_COMMUNITY): Payer: Self-pay | Admitting: Physician Assistant

## 2017-03-27 DIAGNOSIS — S60462A Insect bite (nonvenomous) of right middle finger, initial encounter: Secondary | ICD-10-CM | POA: Diagnosis not present

## 2017-03-27 DIAGNOSIS — R2231 Localized swelling, mass and lump, right upper limb: Secondary | ICD-10-CM

## 2017-03-27 DIAGNOSIS — W57XXXA Bitten or stung by nonvenomous insect and other nonvenomous arthropods, initial encounter: Secondary | ICD-10-CM

## 2017-03-27 MED ORDER — METHYLPREDNISOLONE SODIUM SUCC 125 MG IJ SOLR
125.0000 mg | Freq: Once | INTRAMUSCULAR | Status: AC
  Administered 2017-03-27: 125 mg via INTRAMUSCULAR

## 2017-03-27 MED ORDER — METHYLPREDNISOLONE SODIUM SUCC 125 MG IJ SOLR
INTRAMUSCULAR | Status: AC
  Filled 2017-03-27: qty 2

## 2017-03-27 MED ORDER — KETOROLAC TROMETHAMINE 30 MG/ML IJ SOLN
INTRAMUSCULAR | Status: AC
  Filled 2017-03-27: qty 1

## 2017-03-27 MED ORDER — KETOROLAC TROMETHAMINE 30 MG/ML IJ SOLN
30.0000 mg | Freq: Once | INTRAMUSCULAR | Status: AC
  Administered 2017-03-27: 30 mg via INTRAMUSCULAR

## 2017-03-27 MED ORDER — PREDNISONE 10 MG (21) PO TBPK: ORAL_TABLET | Freq: Every day | ORAL | 0 refills | Status: DC

## 2017-03-27 NOTE — ED Triage Notes (Signed)
Patient reports right middle finger started feeling uncomfortable yesterday today woke up with swelling and redness. Streaking noted up right posterior hand into forearm.

## 2017-03-27 NOTE — Discharge Instructions (Signed)
Take prednisone as directed. Over the counter antihistamines (zyrtec, allegra, benadryl, etc.) for swelling. Ice compress for swelling and inflammation. You can take ibuprofen 600 mg 3 times a day for pain and inflammation. Monitor for worsening of symptoms, fever, spreading redness, increased warmth, increased pain, follow-up for reevaluation.

## 2017-03-27 NOTE — ED Provider Notes (Signed)
[facility]    CSN: [id number] Arrival date & time: 03/27/17  1945     History   Chief Complaint Chief Complaint  Patient presents with  . Insect Bite    HPI Stacey Reynolds is a 26 y.o. female.   26 year old female with one-day history of insect bite to the right middle finger. She has associated swelling and pain. She says swelling have the right dorsal of the arm, and experiences pain up to her forearm. Denies numbness, tingling. Denies fever, chills, night sweats. Denies spreading redness, increased warmth, discharge. Has not taken anything for it. Denies trouble breathing, shortness of breath, wheezing.      Past Medical History:  Diagnosis Date  . Hypertension   . Obesity     Patient Active Problem List   Diagnosis Date Noted  . DUB (dysfunctional uterine bleeding) 09/23/2015  . Anemia due to blood loss, chronic 08/29/2015    Past Surgical History:  Procedure Laterality Date  . TONSILLECTOMY      OB History    No data available       Home Medications    Prior to Admission medications   Medication Sig Start Date End Date Taking? Authorizing Provider  norgestrel-ethinyl estradiol (LO/OVRAL,CRYSELLE) 0.3-30 MG-MCG tablet Take 1 tablet by mouth daily. 01/16/17  Yes Harraway-Smith, Eber Jonesarolyn, MD  Aspirin-Caffeine (BAYER BACK & BODY) 500-32.5 MG TABS Take 1 tablet by mouth 2 (two) times daily as needed (Pain).    [provider]  predniSONE (STERAPRED UNI-PAK 21 TAB) 10 MG (21) TBPK tablet Take by mouth daily. Take 6 tabs by mouth day 1, then 5 tabs, then 4 tabs, then 3 tabs, 2 tabs, then 1 tab for the last day 03/27/17   Belinda FisherYu, Kanoa Phillippi V, PA-C  vitamin B-12 (CYANOCOBALAMIN) 100 MCG tablet Take 100 mcg by mouth daily.    [provider]    Family History Family History  Problem Relation Age of Onset  . Cancer Father     Social History Social History  Substance Use Topics  . Smoking status: Never Smoker  . Smokeless tobacco: Never  Used  . Alcohol use Yes     Comment: occasionally     Allergies   Patient has no known allergies.   Review of Systems Review of Systems  Respiratory: Negative for cough, chest tightness, shortness of breath and wheezing.   Cardiovascular: Negative for chest pain, palpitations and leg swelling.  Neurological: Negative for dizziness, weakness, numbness and headaches.     Physical Exam Triage Vital Signs ED Triage Vitals  Enc Vitals Group     BP 03/27/17 2016 (!) 190/112     Pulse Rate 03/27/17 2016 95     Resp 03/27/17 2016 18     Temp 03/27/17 2016 98.2 F (36.8 C)     Temp Source 03/27/17 2016 Oral     SpO2 03/27/17 2016 100 %     Weight --      Height --      Head Circumference --      Peak Flow --      Pain Score 03/27/17 2022 8     Pain Loc --      Pain Edu? --      Excl. in GC? --    No data found.   Updated Vital Signs BP (!) 190/112 (BP Location: Right Wrist)   Pulse 95   Temp 98.2 F (36.8 C) (Oral)   Resp 18   SpO2 100%  Physical Exam  Constitutional: She is oriented to person, place, and time. She appears well-developed and well-nourished. No distress.  Pulmonary/Chest: Effort normal. No respiratory distress.  Musculoskeletal:  Mild tenderness on palpation of the right middle finger. Full range of motion of fingers and wrist. Strength normal and equal bilaterally. Sensation intact. Radial pulses 2+ and equal bilaterally. Capillary refill unable to assess due to nail polish.  Neurological: She is alert and oriented to person, place, and time.  Skin: Skin is warm and dry.  Erythema and swelling of the right middle finger. 2 open wound seen on the proximal phalanx. Erythema extending to right dorsal hand. No increased warmth.      UC Treatments / Results  Labs (all labs ordered are listed, but only abnormal results are displayed) Labs Reviewed - No data to display  EKG  EKG Interpretation None       Radiology No results  found.  Procedures Procedures (including critical care time)  Medications Ordered in UC Medications  methylPREDNISolone sodium succinate (SOLU-MEDROL) 125 mg/2 mL injection 125 mg (not administered)  ketorolac (TORADOL) 30 MG/ML injection 30 mg (not administered)     Initial Impression / Assessment and Plan / UC Course  I have reviewed the triage vital signs and the nursing notes.  Pertinent labs & imaging results that were available during my care of the patient were reviewed by me and considered in my medical decision making (see chart for details).    Discussed with patient history and exam most consistent with swelling due to insect bite. Also with erythema, no increased warmth, induration felt, low suspicion for cellulitis of right now. Solu-Medrol and Toradol injection given in office for swelling and pain. Prednisone as directed. Patient to take over-the-counter antihistamines for swelling. Ice compress. Can take ibuprofen for pain. Discussed vitals at this visit, patient states the pressure is usually in the 120s. Given patient without chest pain, shortness of breath, wheezing, headache, weakness, will have patient to monitor blood pressure at home, follow up with PCP for further evaluation of hypertension. Return precautions given.  Final Clinical Impressions(s) / UC Diagnoses   Final diagnoses:  Insect bite, initial encounter    New Prescriptions New Prescriptions   PREDNISONE (STERAPRED UNI-PAK 21 TAB) 10 MG (21) TBPK TABLET    Take by mouth daily. Take 6 tabs by mouth day 1, then 5 tabs, then 4 tabs, then 3 tabs, 2 tabs, then 1 tab for the last day       Lurline Idol 03/27/17 2106

## 2017-05-21 ENCOUNTER — Ambulatory Visit (HOSPITAL_COMMUNITY)
Admission: EM | Admit: 2017-05-21 | Discharge: 2017-05-21 | Disposition: A | Discharge status: Home / Self Care | Payer: BLUE CROSS/BLUE SHIELD | Attending: Urgent Care | Admitting: Urgent Care

## 2017-05-21 ENCOUNTER — Encounter (HOSPITAL_COMMUNITY): Payer: Self-pay | Admitting: *Deleted

## 2017-05-21 DIAGNOSIS — N3000 Acute cystitis without hematuria: Secondary | ICD-10-CM

## 2017-05-21 DIAGNOSIS — R350 Frequency of micturition: Secondary | ICD-10-CM | POA: Diagnosis not present

## 2017-05-21 DIAGNOSIS — Z87440 Personal history of urinary (tract) infections: Secondary | ICD-10-CM | POA: Insufficient documentation

## 2017-05-21 DIAGNOSIS — R030 Elevated blood-pressure reading, without diagnosis of hypertension: Secondary | ICD-10-CM

## 2017-05-21 DIAGNOSIS — R358 Other polyuria: Secondary | ICD-10-CM | POA: Diagnosis present

## 2017-05-21 DIAGNOSIS — R300 Dysuria: Secondary | ICD-10-CM | POA: Diagnosis present

## 2017-05-21 DIAGNOSIS — I10 Essential (primary) hypertension: Secondary | ICD-10-CM

## 2017-05-21 DIAGNOSIS — R319 Hematuria, unspecified: Secondary | ICD-10-CM | POA: Diagnosis present

## 2017-05-21 LAB — POCT URINALYSIS DIP (DEVICE)
Bilirubin Urine: NEGATIVE
Glucose, UA: NEGATIVE mg/dL
Hgb urine dipstick: NEGATIVE
Ketones, ur: NEGATIVE mg/dL
Nitrite: POSITIVE — AB
Protein, ur: 100 mg/dL — AB
Specific Gravity, Urine: 1.025 (ref 1.005–1.030)
Urobilinogen, UA: 0.2 mg/dL (ref 0.0–1.0)
pH: 7 (ref 5.0–8.0)

## 2017-05-21 MED ORDER — SULFAMETHOXAZOLE-TRIMETHOPRIM 800-160 MG PO TABS: 1.0000 | ORAL_TABLET | Freq: Two times a day (BID) | ORAL | 0 refills | Status: DC

## 2017-05-21 NOTE — ED Provider Notes (Signed)
MRN: [id number] DOB: Jan 06, 1991  Subjective:   Stacey Reynolds is a 26 y.o. female presenting for chief complaint of Polyuria and Dysuria  Reports 3 day history of dysuria, urinary frequency, mild hematuria. Has a history of UTIs, last episode was earlier this year per patient. Denies fever, n/v, abdominal pain, pelvic pain, flank pain, vaginal discharge. She does not hydrate well, holds urine and does not urinate before or after sex. Regarding her blood pressure, patient reports that she has a history of elevated blood pressure readings with illness. She has been advised to check these outside of illness and states that her readings are in the 120's systolic.   No current facility-administered medications for this encounter.   Current Outpatient Prescriptions:  .  norgestrel-ethinyl estradiol (LO/OVRAL,CRYSELLE) 0.3-30 MG-MCG tablet, Take 1 tablet by mouth daily., Disp: 1 Package, Rfl: 12 .  Aspirin-Caffeine (BAYER BACK & BODY) 500-32.5 MG TABS, Take 1 tablet by mouth 2 (two) times daily as needed (Pain)., Disp: , Rfl:  .  predniSONE (STERAPRED UNI-PAK 21 TAB) 10 MG (21) TBPK tablet, Take by mouth daily. Take 6 tabs by mouth day 1, then 5 tabs, then 4 tabs, then 3 tabs, 2 tabs, then 1 tab for the last day, Disp: 21 tablet, Rfl: 0 .  vitamin B-12 (CYANOCOBALAMIN) 100 MCG tablet, Take 100 mcg by mouth daily., Disp: , Rfl:    Stacey Reynolds has No Known Allergies.  Stacey Reynolds  has a past medical history of Hypertension and Obesity. Also  has a past surgical history that includes Tonsillectomy.  Objective:   Vitals: BP (!) 188/117 (BP Location: Left Arm)   Pulse 97   Temp 99.1 F (37.3 C) (Oral)   Resp 17   SpO2 100%   BP Readings from Last 3 Encounters:  05/21/17 (!) 188/117  03/27/17 (!) 190/112  01/16/17 (!) 151/107   Physical Exam  Constitutional: She is oriented to person, place, and time. She appears well-developed and well-nourished.  Cardiovascular: Normal rate, regular rhythm and intact  distal pulses.  Exam reveals no gallop and no friction rub.   No murmur heard. Pulmonary/Chest: No respiratory distress. She has no wheezes. She has no rales.  Abdominal: Soft. Bowel sounds are normal. She exhibits no distension and no mass. There is no tenderness.  No CVA tenderness.  Musculoskeletal: She exhibits no edema.  Neurological: She is alert and oriented to person, place, and time.  Skin: Skin is warm and dry. No rash noted. No erythema. No pallor.   Results for orders placed or performed during the hospital encounter of 05/21/17 (from the past 24 hour(s))  POCT urinalysis dip (device)     Status: Abnormal   Collection Time: 05/21/17  1:58 PM  Result Value Ref Range   Glucose, UA NEGATIVE NEGATIVE mg/dL   Bilirubin Urine NEGATIVE NEGATIVE   Ketones, ur NEGATIVE NEGATIVE mg/dL   Specific Gravity, Urine 1.025 1.005 - 1.030   Hgb urine dipstick NEGATIVE NEGATIVE   pH 7.0 5.0 - 8.0   Protein, ur 100 (A) NEGATIVE mg/dL   Urobilinogen, UA 0.2 0.0 - 1.0 mg/dL   Nitrite POSITIVE (A) NEGATIVE   Leukocytes, UA SMALL (A) NEGATIVE   Assessment and Plan :   Acute cystitis without hematuria  Elevated blood pressure reading  Essential hypertension  Urine culture pending, will start patient on Bactrim. Advised aggressive hydration, recommended lifestyle changes for prevention of UTIs. Patient plans on setting up PCP for management of her HTN. Return-to-clinic precautions discussed, patient verbalized understanding.  Wallis Bamberg, PA-C [facility]  05/21/2017  2:24 PM    Wallis Bamberg, PA-C 05/21/17 1459

## 2017-05-21 NOTE — ED Triage Notes (Signed)
Patient reports polyuria and dysuria x 3 days. Denies abdominal pain or flank pain.

## 2017-05-23 LAB — URINE CULTURE: Culture: >=100000 — AB

## 2017-12-19 ENCOUNTER — Telehealth: Payer: Self-pay

## 2017-12-19 DIAGNOSIS — N938 Other specified abnormal uterine and vaginal bleeding: Secondary | ICD-10-CM

## 2017-12-19 MED ORDER — NORGESTREL-ETHINYL ESTRADIOL 0.3-30 MG-MCG PO TABS: 1.0000 | ORAL_TABLET | Freq: Every day | ORAL | 3 refills | Status: DC

## 2017-12-19 NOTE — Telephone Encounter (Signed)
Pt called the nurses station requesting to have a three month supply of he BC so she does run out.  I advised pt to contact the front office to schedule appt for annual so that she will be able to get 12 refills and that I will go ahead and order her [facility] per protocol for three refills.  Verified that pt wanted to have her [facility] sent to [facility] on [address]. Pt agreed.  Pt stated understanding with no further questions.

## 2018-02-20 ENCOUNTER — Ambulatory Visit (INDEPENDENT_AMBULATORY_CARE_PROVIDER_SITE_OTHER): Payer: BLUE CROSS/BLUE SHIELD | Admitting: Obstetrics & Gynecology

## 2018-02-20 ENCOUNTER — Encounter: Payer: Self-pay | Admitting: Obstetrics & Gynecology

## 2018-02-20 VITALS — BP 181/124 | HR 100 | Ht 67.0 in | Wt 340.0 lb

## 2018-02-20 DIAGNOSIS — N938 Other specified abnormal uterine and vaginal bleeding: Secondary | ICD-10-CM

## 2018-02-20 DIAGNOSIS — Z01419 Encounter for gynecological examination (general) (routine) without abnormal findings: Secondary | ICD-10-CM

## 2018-02-20 DIAGNOSIS — Z113 Encounter for screening for infections with a predominantly sexual mode of transmission: Secondary | ICD-10-CM

## 2018-02-20 DIAGNOSIS — Z3009 Encounter for other general counseling and advice on contraception: Secondary | ICD-10-CM

## 2018-02-20 DIAGNOSIS — I10 Essential (primary) hypertension: Secondary | ICD-10-CM

## 2018-02-20 DIAGNOSIS — Z124 Encounter for screening for malignant neoplasm of cervix: Secondary | ICD-10-CM | POA: Diagnosis not present

## 2018-02-20 MED ORDER — LISINOPRIL-HYDROCHLOROTHIAZIDE 10-12.5 MG PO TABS: 1.0000 | ORAL_TABLET | Freq: Every day | ORAL | 1 refills | Status: DC

## 2018-02-20 MED ORDER — NORGESTREL-ETHINYL ESTRADIOL 0.3-30 MG-MCG PO TABS: 1.0000 | ORAL_TABLET | Freq: Every day | ORAL | 3 refills | Status: DC

## 2018-02-20 NOTE — Progress Notes (Signed)
Subjective:     Stacey Reynolds is a 27 y.o. female here for a routine exam.  G0 LMP 02/15/2018 Current complaints: none. Pt denies HA no visual changes noted. Pt reports a significant family history of HTN.   Since her last visit pt initially gained weight then began working out with a trainer who placed her on a healthy eating plan. She reports that she has lost >90#s. Her mother is assisting her on weight loss journey.     Gynecologic History Patient's last menstrual period was 02/15/2018 (exact date). Contraception: OCP (estrogen/progesterone) Last Pap: 01/17/2017. Results were: normal Last mammogram: n/a.   Obstetric History OB History  No data available   The following portions of the patient's history were reviewed and updated as appropriate: allergies, current medications, past family history, past medical history, past social history, past surgical history and problem list.  Review of Systems Pertinent items are noted in HPI.    Objective:  BP (!) 181/124   Pulse 100   Ht 5\' 7"  (1.702 m)   Wt (!) 340 lb (154.2 kg)   LMP 02/15/2018 (Exact Date)   BMI 53.25 kg/m   General Appearance:    Alert, cooperative, no distress, appears stated age  Head:    Normocephalic, without obvious abnormality, atraumatic  Eyes:    conjunctiva/corneas clear, EOM's intact, both eyes  Ears:    Normal external ear canals, both ears  Nose:   Nares normal, septum midline, mucosa normal, no drainage    or sinus tenderness  Throat:   Lips, mucosa, and tongue normal; teeth and gums normal  Neck:   Supple, symmetrical, trachea midline, no adenopathy;    thyroid:  no enlargement/tenderness/nodules  Back:     Symmetric, no curvature, ROM normal, no CVA tenderness  Lungs:     Clear to auscultation bilaterally, respirations unlabored  Chest Wall:    No tenderness or deformity   Heart:    Regular rate and rhythm, S1 and S2 normal, no murmur, rub   or gallop  Breast Exam:    No tenderness, masses, or nipple  abnormality; inverted nipple on right side.   Abdomen:     Soft, non-tender, bowel sounds active all four quadrants,    no masses, no organomegaly  Genitalia:    Normal female without lesion, discharge or tenderness     Extremities:   Extremities normal, atraumatic, no cyanosis or edema  Pulses:   2+ and symmetric all extremities  Skin:   Skin color, texture, turgor normal, no rashes or lesions    Assessment:    Healthy female exam.   Obesity- SIGNIFICANT weight loss since last visit >90#s- commended pt on progress.  Elevated BP- given FH I suspect Benign essential HTN. I have reviewed with pt the silent nature of the Ds and the potential risks assoc with chronically elevated BP and especially with BPs in this range. Although she is already making changes so I recommend beginning meds.   STI screen   Plan:   Kudos to pt on weight loss. KEEP UP THE GOOD WORK Referral to primary care for management of HTN Begin Zestoretic 10/12.5 1 po q day F/u in 2 weeks for BP check if unable to get primary care appt prior to that time F/u PAP and cx Labs: HIV, RPR, Hep B & C F/u in 1 year for annual or sooner prn  Edem Tiegs L. Harraway-Smith, M.D., [facility]

## 2018-02-20 NOTE — Patient Instructions (Signed)
Hypertension Hypertension, commonly called high blood pressure, is when the force of blood pumping through the arteries is too strong. The arteries are the blood vessels that carry blood from the heart throughout the body. Hypertension forces the heart to work harder to pump blood and may cause arteries to become narrow or stiff. Having untreated or uncontrolled hypertension can cause heart attacks, strokes, kidney disease, and other problems. A blood pressure reading consists of a higher number over a lower number. Ideally, your blood pressure should be below 120/80. The first ("top") number is called the systolic pressure. It is a measure of the pressure in your arteries as your heart beats. The second ("bottom") number is called the diastolic pressure. It is a measure of the pressure in your arteries as the heart relaxes. What are the causes? The cause of this condition is not known. What increases the risk? Some risk factors for high blood pressure are under your control. Others are not. Factors you can change  Smoking.  Having type 2 diabetes mellitus, high cholesterol, or both.  Not getting enough exercise or physical activity.  Being overweight.  Having too much fat, sugar, calories, or salt (sodium) in your diet.  Drinking too much alcohol. Factors that are difficult or impossible to change  Having chronic kidney disease.  Having a family history of high blood pressure.  Age. Risk increases with age.  Race. You may be at higher risk if you are African-American.  Gender. Men are at higher risk than women before age 45. After age 65, women are at higher risk than men.  Having obstructive sleep apnea.  Stress. What are the signs or symptoms? Extremely high blood pressure (hypertensive crisis) may cause:  Headache.  Anxiety.  Shortness of breath.  Nosebleed.  Nausea and vomiting.  Severe chest pain.  Jerky movements you cannot control (seizures).  How is this  diagnosed? This condition is diagnosed by measuring your blood pressure while you are seated, with your arm resting on a surface. The cuff of the blood pressure monitor will be placed directly against the skin of your upper arm at the level of your heart. It should be measured at least twice using the same arm. Certain conditions can cause a difference in blood pressure between your right and left arms. Certain factors can cause blood pressure readings to be lower or higher than normal (elevated) for a short period of time:  When your blood pressure is higher when you are in a health care provider's office than when you are at home, this is called white coat hypertension. Most people with this condition do not need medicines.  When your blood pressure is higher at home than when you are in a health care provider's office, this is called masked hypertension. Most people with this condition may need medicines to control blood pressure.  If you have a high blood pressure reading during one visit or you have normal blood pressure with other risk factors:  You may be asked to return on a different day to have your blood pressure checked again.  You may be asked to monitor your blood pressure at home for 1 week or longer.  If you are diagnosed with hypertension, you may have other blood or imaging tests to help your health care provider understand your overall risk for other conditions. How is this treated? This condition is treated by making healthy lifestyle changes, such as eating healthy foods, exercising more, and reducing your alcohol intake. Your   health care provider may prescribe medicine if lifestyle changes are not enough to get your blood pressure under control, and if:  Your systolic blood pressure is above 130.  Your diastolic blood pressure is above 80.  Your personal target blood pressure may vary depending on your medical conditions, your age, and other factors. Follow these  instructions at home: Eating and drinking  Eat a diet that is high in fiber and potassium, and low in sodium, added sugar, and fat. An example eating plan is called the DASH (Dietary Approaches to Stop Hypertension) diet. To eat this way: ? Eat plenty of fresh fruits and vegetables. Try to fill half of your plate at each meal with fruits and vegetables. ? Eat whole grains, such as whole wheat pasta, brown rice, or whole grain bread. Fill about one quarter of your plate with whole grains. ? Eat or drink low-fat dairy products, such as skim milk or low-fat yogurt. ? Avoid fatty cuts of meat, processed or cured meats, and poultry with skin. Fill about one quarter of your plate with lean proteins, such as fish, chicken without skin, beans, eggs, and tofu. ? Avoid premade and processed foods. These tend to be higher in sodium, added sugar, and fat.  Reduce your daily sodium intake. Most people with hypertension should eat less than 1,500 mg of sodium a day.  Limit alcohol intake to no more than 1 drink a day for nonpregnant women and 2 drinks a day for men. One drink equals 12 oz of beer, 5 oz of wine, or 1 oz of hard liquor. Lifestyle  Work with your health care provider to maintain a healthy body weight or to lose weight. Ask what an ideal weight is for you.  Get at least 30 minutes of exercise that causes your heart to beat faster (aerobic exercise) most days of the week. Activities may include walking, swimming, or biking.  Include exercise to strengthen your muscles (resistance exercise), such as pilates or lifting weights, as part of your weekly exercise routine. Try to do these types of exercises for 30 minutes at least 3 days a week.  Do not use any products that contain nicotine or tobacco, such as cigarettes and e-cigarettes. If you need help quitting, ask your health care provider.  Monitor your blood pressure at home as told by your health care provider.  Keep all follow-up visits as  told by your health care provider. This is important. Medicines  Take over-the-counter and prescription medicines only as told by your health care provider. Follow directions carefully. Blood pressure medicines must be taken as prescribed.  Do not skip doses of blood pressure medicine. Doing this puts you at risk for problems and can make the medicine less effective.  Ask your health care provider about side effects or reactions to medicines that you should watch for. Contact a health care provider if:  You think you are having a reaction to a medicine you are taking.  You have headaches that keep coming back (recurring).  You feel dizzy.  You have swelling in your ankles.  You have trouble with your vision. Get help right away if:  You develop a severe headache or confusion.  You have unusual weakness or numbness.  You feel faint.  You have severe pain in your chest or abdomen.  You vomit repeatedly.  You have trouble breathing. Summary  Hypertension is when the force of blood pumping through your arteries is too strong. If this condition is not   controlled, it may put you at risk for serious complications.  Your personal target blood pressure may vary depending on your medical conditions, your age, and other factors. For most people, a normal blood pressure is less than 120/80.  Hypertension is treated with lifestyle changes, medicines, or a combination of both. Lifestyle changes include weight loss, eating a healthy, low-sodium diet, exercising more, and limiting alcohol. This information is not intended to replace advice given to you by your health care provider. Make sure you discuss any questions you have with your health care provider. Document Released: 08/01/2005 Document Revised: 06/29/2016 Document Reviewed: 06/29/2016 Elsevier Interactive Patient Education  2018 [employer]. Managing Your Hypertension Hypertension is commonly called high blood pressure. This is  when the force of your blood pressing against the walls of your arteries is too strong. Arteries are blood vessels that carry blood from your heart throughout your body. Hypertension forces the heart to work harder to pump blood, and may cause the arteries to become narrow or stiff. Having untreated or uncontrolled hypertension can cause heart attack, stroke, kidney disease, and other problems. What are blood pressure readings? A blood pressure reading consists of a higher number over a lower number. Ideally, your blood pressure should be below 120/80. The first ("top") number is called the systolic pressure. It is a measure of the pressure in your arteries as your heart beats. The second ("bottom") number is called the diastolic pressure. It is a measure of the pressure in your arteries as the heart relaxes. What does my blood pressure reading mean? Blood pressure is classified into four stages. Based on your blood pressure reading, your health care provider may use the following stages to determine what type of treatment you need, if any. Systolic pressure and diastolic pressure are measured in a unit called mm Hg. Normal  Systolic pressure: below 120.  Diastolic pressure: below 80. Elevated  Systolic pressure: 120-129.  Diastolic pressure: below 80. Hypertension stage 1  Systolic pressure: 130-139.  Diastolic pressure: 80-89. Hypertension stage 2  Systolic pressure: 140 or above.  Diastolic pressure: 90 or above. What health risks are associated with hypertension? Managing your hypertension is an important responsibility. Uncontrolled hypertension can lead to:  A heart attack.  A stroke.  A weakened blood vessel (aneurysm).  Heart failure.  Kidney damage.  Eye damage.  Metabolic syndrome.  Memory and concentration problems.  What changes can I make to manage my hypertension? Hypertension can be managed by making lifestyle changes and possibly by taking medicines. Your  health care provider will help you make a plan to bring your blood pressure within a normal range. Eating and drinking  Eat a diet that is high in fiber and potassium, and low in salt (sodium), added sugar, and fat. An example eating plan is called the DASH (Dietary Approaches to Stop Hypertension) diet. To eat this way: ? Eat plenty of fresh fruits and vegetables. Try to fill half of your plate at each meal with fruits and vegetables. ? Eat whole grains, such as whole wheat pasta, brown rice, or whole grain bread. Fill about one quarter of your plate with whole grains. ? Eat low-fat diary products. ? Avoid fatty cuts of meat, processed or cured meats, and poultry with skin. Fill about one quarter of your plate with lean proteins such as fish, chicken without skin, beans, eggs, and tofu. ? Avoid premade and processed foods. These tend to be higher in sodium, added sugar, and fat.  Reduce your  daily sodium intake. Most people with hypertension should eat less than 1,500 mg of sodium a day.  Limit alcohol intake to no more than 1 drink a day for nonpregnant women and 2 drinks a day for men. One drink equals 12 oz of beer, 5 oz of wine, or 1 oz of hard liquor. Lifestyle  Work with your health care provider to maintain a healthy body weight, or to lose weight. Ask what an ideal weight is for you.  Get at least 30 minutes of exercise that causes your heart to beat faster (aerobic exercise) most days of the week. Activities may include walking, swimming, or biking.  Include exercise to strengthen your muscles (resistance exercise), such as weight lifting, as part of your weekly exercise routine. Try to do these types of exercises for 30 minutes at least 3 days a week.  Do not use any products that contain nicotine or tobacco, such as cigarettes and e-cigarettes. If you need help quitting, ask your health care provider.  Control any long-term (chronic) conditions you have, such as high cholesterol  or diabetes. Monitoring  Monitor your blood pressure at home as told by your health care provider. Your personal target blood pressure may vary depending on your medical conditions, your age, and other factors.  Have your blood pressure checked regularly, as often as told by your health care provider. Working with your health care provider  Review all the medicines you take with your health care provider because there may be side effects or interactions.  Talk with your health care provider about your diet, exercise habits, and other lifestyle factors that may be contributing to hypertension.  Visit your health care provider regularly. Your health care provider can help you create and adjust your plan for managing hypertension. Will I need medicine to control my blood pressure? Your health care provider may prescribe medicine if lifestyle changes are not enough to get your blood pressure under control, and if:  Your systolic blood pressure is 130 or higher.  Your diastolic blood pressure is 80 or higher.  Take medicines only as told by your health care provider. Follow the directions carefully. Blood pressure medicines must be taken as prescribed. The medicine does not work as well when you skip doses. Skipping doses also puts you at risk for problems. Contact a health care provider if:  You think you are having a reaction to medicines you have taken.  You have repeated (recurrent) headaches.  You feel dizzy.  You have swelling in your ankles.  You have trouble with your vision. Get help right away if:  You develop a severe headache or confusion.  You have unusual weakness or numbness, or you feel faint.  You have severe pain in your chest or abdomen.  You vomit repeatedly.  You have trouble breathing. Summary  Hypertension is when the force of blood pumping through your arteries is too strong. If this condition is not controlled, it may put you at risk for serious  complications.  Your personal target blood pressure may vary depending on your medical conditions, your age, and other factors. For most people, a normal blood pressure is less than 120/80.  Hypertension is managed by lifestyle changes, medicines, or both. Lifestyle changes include weight loss, eating a healthy, low-sodium diet, exercising more, and limiting alcohol. This information is not intended to replace advice given to you by your health care provider. Make sure you discuss any questions you have with your health care provider. Document  Released: 04/25/2012 Document Revised: 06/29/2016 Document Reviewed: 06/29/2016 Elsevier Interactive Patient Education  [employer].

## 2018-02-20 NOTE — Progress Notes (Signed)
Scheduled appt with Lacey JensenLebauer Brassfield to initiate primary care 7/19 @ 3pm

## 2018-02-21 LAB — HEPATITIS C ANTIBODY: Hep C Virus Ab: <0.1 s/co ratio (ref 0.0–0.9)

## 2018-02-21 LAB — RPR: RPR Ser Ql: NONREACTIVE

## 2018-02-21 LAB — HIV ANTIBODY (ROUTINE TESTING W REFLEX): HIV Screen 4th Generation wRfx: NONREACTIVE

## 2018-02-21 LAB — HEPATITIS B SURFACE ANTIGEN: Hepatitis B Surface Ag: NEGATIVE

## 2018-02-22 LAB — CYTOLOGY - PAP
Chlamydia: NEGATIVE
Neisseria Gonorrhea: NEGATIVE
Trichomonas: NEGATIVE

## 2018-03-02 ENCOUNTER — Telehealth: Payer: Self-pay | Admitting: General Practice

## 2018-03-02 ENCOUNTER — Encounter: Payer: Self-pay | Admitting: Family Medicine

## 2018-03-02 ENCOUNTER — Ambulatory Visit (INDEPENDENT_AMBULATORY_CARE_PROVIDER_SITE_OTHER): Payer: BLUE CROSS/BLUE SHIELD | Admitting: Family Medicine

## 2018-03-02 VITALS — BP 148/92 | HR 80 | Temp 98.2°F | Ht 67.0 in | Wt 342.0 lb

## 2018-03-02 DIAGNOSIS — Z131 Encounter for screening for diabetes mellitus: Secondary | ICD-10-CM

## 2018-03-02 DIAGNOSIS — I10 Essential (primary) hypertension: Secondary | ICD-10-CM | POA: Diagnosis not present

## 2018-03-02 DIAGNOSIS — Z0000 Encounter for general adult medical examination without abnormal findings: Secondary | ICD-10-CM | POA: Diagnosis not present

## 2018-03-02 DIAGNOSIS — Z13220 Encounter for screening for lipoid disorders: Secondary | ICD-10-CM | POA: Diagnosis not present

## 2018-03-02 DIAGNOSIS — E049 Nontoxic goiter, unspecified: Secondary | ICD-10-CM

## 2018-03-02 NOTE — Progress Notes (Signed)
Subjective:     Stacey Reynolds is a 27 y.o. female and is here for a comprehensive physical exam. The patient reports problems - HTN.  Pt started on lisinopril-HCTZ 10-12.5 mg daily by her OB/GYN as her BP was 181/124 on 02/20/18.  Pt states she is eating better, exercising 5 times per week and has lost 93 pounds.  Pt endorses family history of HTN.  Social History   Socioeconomic History  . Marital status: Single    Spouse name: Not on file  . Number of children: Not on file  . Years of education: Not on file  . Highest education level: Not on file  Occupational History  . Not on file  Social Needs  . Financial resource strain: Not on file  . Food insecurity:    Worry: Not on file    Inability: Not on file  . Transportation needs:    Medical: Not on file    Non-medical: Not on file  Tobacco Use  . Smoking status: Never Smoker  . Smokeless tobacco: Never Used  Substance and Sexual Activity  . Alcohol use: Yes    Comment: occasionally  . Drug use: No  . Sexual activity: Yes    Birth control/protection: Condom    Comment: occasional condom use  Lifestyle  . Physical activity:    Days per week: Not on file    Minutes per session: Not on file  . Stress: Not on file  Relationships  . Social connections:    Talks on phone: Not on file    Gets together: Not on file    Attends religious service: Not on file    Active member of club or organization: Not on file    Attends meetings of clubs or organizations: Not on file    Relationship status: Not on file  . Intimate partner violence:    Fear of current or ex partner: Not on file    Emotionally abused: Not on file    Physically abused: Not on file    Forced sexual activity: Not on file  Other Topics Concern  . Not on file  Social History Narrative  . Not on file   Health Maintenance  Topic Date Due  . TETANUS/TDAP  06/04/2010  . INFLUENZA VACCINE  03/15/2018  . PAP SMEAR  02/20/2021  . HIV Screening  Completed     The following portions of the patient's history were reviewed and updated as appropriate: allergies, current medications, past family history, past medical history, past social history, past surgical history and problem list.  Review of Systems A comprehensive review of systems was negative.   Objective:    BP (!) 148/92 (BP Location: Left Arm, Patient Position: Sitting, Cuff Size: Large) Comment (Cuff Size): thigh cuff  Pulse 80   Temp 98.2 F (36.8 C) (Oral)   Ht 5\' 7"  (1.702 m)   Wt (!) 342 lb (155.1 kg)   LMP 02/15/2018 (Exact Date)   SpO2 98%   BMI 53.56 kg/m  General appearance: alert, cooperative, appears stated age and no distress Head: Normocephalic, without obvious abnormality, atraumatic Eyes: conjunctivae/corneas clear. PERRL, EOM's intact. Fundi benign. Ears: normal TM's and external ear canals both ears Nose: Nares normal. Septum midline. Mucosa normal. No drainage or sinus tenderness. Throat: lips, mucosa, and tongue normal; teeth and gums normal Neck: no adenopathy, no JVD, supple, symmetrical, trachea midline and thyroid not enlarged, symmetric, no tenderness/mass/nodules Lungs: clear to auscultation bilaterally Heart: regular rate and rhythm, S1,  S2 normal, no murmur, click, rub or gallop Abdomen: soft, non-tender; bowel sounds normal; no masses,  no organomegaly Extremities: extremities normal, atraumatic, no cyanosis or edema Skin: Skin color, texture, turgor normal. No rashes or lesions Neurologic: Alert and oriented X 3, normal strength and tone. Normal symmetric reflexes. Normal coordination and gait    Assessment:    Healthy female exam with HTN     Plan:     Anticipatory guidance given including wearing seatbelts, smoke detectors in the home, increasing physical activity, increasing p.o. intake of water and vegetables. -will obtain labs.  Pt to RTC for labs when fasting -Pap up to date -next CPE in 1 yr. See After Visit Summary for Counseling  Recommendations    HTN -Continue lisinopril-hydrochlorothiazide 10-12.5 mg daily -Patient encouraged to obtain BP cuff for home monitoring and keep a log. -Continue lifestyle modifications -Patient to follow-up in clinic in 2 weeks for BP recheck and medication adjustment PRN  Abbe AmsterdamShannon Aryaa Bunting, MD

## 2018-03-02 NOTE — Telephone Encounter (Signed)
-----   Message from Stacey Rosenthalarolyn Harraway-Smith, MD sent at 03/01/2018  9:56 PM EDT ----- Please call pt. She has an abnormal PAP and needs a colpo.  Thx, clh-S

## 2018-03-02 NOTE — Patient Instructions (Addendum)
Preventive Care 18-39 Years, Female Preventive care refers to lifestyle choices and visits with your health care provider that can promote health and wellness. What does preventive care include?  A yearly physical exam. This is also called an annual well check.  Dental exams once or twice a year.  Routine eye exams. Ask your health care provider how often you should have your eyes checked.  Personal lifestyle choices, including: ? Daily care of your teeth and gums. ? Regular physical activity. ? Eating a healthy diet. ? Avoiding tobacco and drug use. ? Limiting alcohol use. ? Practicing safe sex. ? Taking vitamin and mineral supplements as recommended by your health care provider. What happens during an annual well check? The services and screenings done by your health care provider during your annual well check will depend on your age, overall health, lifestyle risk factors, and family history of disease. Counseling Your health care provider may ask you questions about your:  Alcohol use.  Tobacco use.  Drug use.  Emotional well-being.  Home and relationship well-being.  Sexual activity.  Eating habits.  Work and work Statistician.  Method of birth control.  Menstrual cycle.  Pregnancy history.  Screening You may have the following tests or measurements:  Height, weight, and BMI.  Diabetes screening. This is done by checking your blood sugar (glucose) after you have not eaten for a while (fasting).  Blood pressure.  Lipid and cholesterol levels. These may be checked every 5 years starting at age 66.  Skin check.  Hepatitis C blood test.  Hepatitis B blood test.  Sexually transmitted disease (STD) testing.  BRCA-related cancer screening. This may be done if you have a family history of breast, ovarian, tubal, or peritoneal cancers.  Pelvic exam and Pap test. This may be done every 3 years starting at age 40. Starting at age 59, this may be done every 5  years if you have a Pap test in combination with an HPV test.  Discuss your test results, treatment options, and if necessary, the need for more tests with your health care provider. Vaccines Your health care provider may recommend certain vaccines, such as:  Influenza vaccine. This is recommended every year.  Tetanus, diphtheria, and acellular pertussis (Tdap, Td) vaccine. You may need a Td booster every 10 years.  Varicella vaccine. You may need this if you have not been vaccinated.  HPV vaccine. If you are 69 or younger, you may need three doses over 6 months.  Measles, mumps, and rubella (MMR) vaccine. You may need at least one dose of MMR. You may also need a second dose.  Pneumococcal 13-valent conjugate (PCV13) vaccine. You may need this if you have certain conditions and were not previously vaccinated.  Pneumococcal polysaccharide (PPSV23) vaccine. You may need one or two doses if you smoke cigarettes or if you have certain conditions.  Meningococcal vaccine. One dose is recommended if you are age 27-21 years and a first-year college student living in a residence hall, or if you have one of several medical conditions. You may also need additional booster doses.  Hepatitis A vaccine. You may need this if you have certain conditions or if you travel or work in places where you may be exposed to hepatitis A.  Hepatitis B vaccine. You may need this if you have certain conditions or if you travel or work in places where you may be exposed to hepatitis B.  Haemophilus influenzae type b (Hib) vaccine. You may need this if  you have certain risk factors.  Talk to your health care provider about which screenings and vaccines you need and how often you need them. This information is not intended to replace advice given to you by your health care provider. Make sure you discuss any questions you have with your health care provider. Document Released: 09/27/2001 Document Revised: 04/20/2016  Document Reviewed: 06/02/2015 Elsevier Interactive Patient Education  2018 [employer].  How to Take Your Blood Pressure Blood pressure is a measurement of how strongly your blood is pressing against the walls of your arteries. Arteries are blood vessels that carry blood from your heart throughout your body. Your health care provider takes your blood pressure at each office visit. You can also take your own blood pressure at home with a blood pressure machine. You may need to take your own blood pressure:  To confirm a diagnosis of high blood pressure (hypertension).  To monitor your blood pressure over time.  To make sure your blood pressure medicine is working.  Supplies needed: To take your blood pressure, you will need a blood pressure machine. You can buy a blood pressure machine, or blood pressure monitor, at most drugstores or online. There are several types of home blood pressure monitors. When choosing one, consider the following:  Choose a monitor that has an arm cuff.  Choose a monitor that wraps snugly around your upper arm. You should be able to fit only one finger between your arm and the cuff.  Do not choose a monitor that measures your blood pressure from your wrist or finger.  Your health care provider can suggest a reliable monitor that will meet your needs. How to prepare To get the most accurate reading, avoid the following for 30 minutes before you check your blood pressure:  Drinking caffeine.  Drinking alcohol.  Eating.  Smoking.  Exercising.  Five minutes before you check your blood pressure:  Empty your bladder.  Sit quietly without talking in a dining chair, rather than in a soft couch or armchair.  How to take your blood pressure To check your blood pressure, follow the instructions in the manual that came with your blood pressure monitor. If you have a digital blood pressure monitor, the instructions may be as follows: 1. Sit up  straight. 2. Place your feet on the floor. Do not cross your ankles or legs. 3. Rest your left arm at the level of your heart on a table or desk or on the arm of a chair. 4. Pull up your shirt sleeve. 5. Wrap the blood pressure cuff around the upper part of your left arm, 1 inch (2.5 cm) above your elbow. It is best to wrap the cuff around bare skin. 6. Fit the cuff snugly around your arm. You should be able to place only one finger between the cuff and your arm. 7. Position the cord inside the groove of your elbow. 8. Press the power button. 9. Sit quietly while the cuff inflates and deflates. 10. Read the digital reading on the monitor screen and write it down (record it). 11. Wait 2-3 minutes, then repeat the steps, starting at step 1.  What does my blood pressure reading mean? A blood pressure reading consists of a higher number over a lower number. Ideally, your blood pressure should be below 120/80. The first ("top") number is called the systolic pressure. It is a measure of the pressure in your arteries as your heart beats. The second ("bottom") number is called the  diastolic pressure. It is a measure of the pressure in your arteries as the heart relaxes. Blood pressure is classified into four stages. The following are the stages for adults who do not have a short-term serious illness or a chronic condition. Systolic pressure and diastolic pressure are measured in a unit called mm Hg. Normal  Systolic pressure: below 732.  Diastolic pressure: below 80. Elevated  Systolic pressure: 202-542.  Diastolic pressure: below 80. Hypertension stage 1  Systolic pressure: 706-237.  Diastolic pressure: 62-83. Hypertension stage 2  Systolic pressure: 151 or above.  Diastolic pressure: 90 or above. You can have prehypertension or hypertension even if only the systolic or only the diastolic number in your reading is higher than normal. Follow these instructions at home:  Check your blood  pressure as often as recommended by your health care provider.  Take your monitor to the next appointment with your health care provider to make sure: ? That you are using it correctly. ? That it provides accurate readings.  Be sure you understand what your goal blood pressure numbers are.  Tell your health care provider if you are having any side effects from blood pressure medicine. Contact a health care provider if:  Your blood pressure is consistently high. Get help right away if:  Your systolic blood pressure is higher than 180.  Your diastolic blood pressure is higher than 110. This information is not intended to replace advice given to you by your health care provider. Make sure you discuss any questions you have with your health care provider. Document Released: 01/08/2016 Document Revised: 03/22/2016 Document Reviewed: 01/08/2016 Elsevier Interactive Patient Education  2018 [employer].  Managing Your Hypertension Hypertension is commonly called high blood pressure. This is when the force of your blood pressing against the walls of your arteries is too strong. Arteries are blood vessels that carry blood from your heart throughout your body. Hypertension forces the heart to work harder to pump blood, and may cause the arteries to become narrow or stiff. Having untreated or uncontrolled hypertension can cause heart attack, stroke, kidney disease, and other problems. What are blood pressure readings? A blood pressure reading consists of a higher number over a lower number. Ideally, your blood pressure should be below 120/80. The first ("top") number is called the systolic pressure. It is a measure of the pressure in your arteries as your heart beats. The second ("bottom") number is called the diastolic pressure. It is a measure of the pressure in your arteries as the heart relaxes. What does my blood pressure reading mean? Blood pressure is classified into four stages. Based on your  blood pressure reading, your health care provider may use the following stages to determine what type of treatment you need, if any. Systolic pressure and diastolic pressure are measured in a unit called mm Hg. Normal  Systolic pressure: below 761.  Diastolic pressure: below 80. Elevated  Systolic pressure: 607-371.  Diastolic pressure: below 80. Hypertension stage 1  Systolic pressure: 062-694.  Diastolic pressure: 85-46. Hypertension stage 2  Systolic pressure: 270 or above.  Diastolic pressure: 90 or above. What health risks are associated with hypertension? Managing your hypertension is an important responsibility. Uncontrolled hypertension can lead to:  A heart attack.  A stroke.  A weakened blood vessel (aneurysm).  Heart failure.  Kidney damage.  Eye damage.  Metabolic syndrome.  Memory and concentration problems.  What changes can I make to manage my hypertension? Hypertension can be managed by making lifestyle  changes and possibly by taking medicines. Your health care provider will help you make a plan to bring your blood pressure within a normal range. Eating and drinking  Eat a diet that is high in fiber and potassium, and low in salt (sodium), added sugar, and fat. An example eating plan is called the DASH (Dietary Approaches to Stop Hypertension) diet. To eat this way: ? Eat plenty of fresh fruits and vegetables. Try to fill half of your plate at each meal with fruits and vegetables. ? Eat whole grains, such as whole wheat pasta, brown rice, or whole grain bread. Fill about one quarter of your plate with whole grains. ? Eat low-fat diary products. ? Avoid fatty cuts of meat, processed or cured meats, and poultry with skin. Fill about one quarter of your plate with lean proteins such as fish, chicken without skin, beans, eggs, and tofu. ? Avoid premade and processed foods. These tend to be higher in sodium, added sugar, and fat.  Reduce your daily sodium  intake. Most people with hypertension should eat less than 1,500 mg of sodium a day.  Limit alcohol intake to no more than 1 drink a day for nonpregnant women and 2 drinks a day for men. One drink equals 12 oz of beer, 5 oz of wine, or 1 oz of hard liquor. Lifestyle  Work with your health care provider to maintain a healthy body weight, or to lose weight. Ask what an ideal weight is for you.  Get at least 30 minutes of exercise that causes your heart to beat faster (aerobic exercise) most days of the week. Activities may include walking, swimming, or biking.  Include exercise to strengthen your muscles (resistance exercise), such as weight lifting, as part of your weekly exercise routine. Try to do these types of exercises for 30 minutes at least 3 days a week.  Do not use any products that contain nicotine or tobacco, such as cigarettes and e-cigarettes. If you need help quitting, ask your health care provider.  Control any long-term (chronic) conditions you have, such as high cholesterol or diabetes. Monitoring  Monitor your blood pressure at home as told by your health care provider. Your personal target blood pressure may vary depending on your medical conditions, your age, and other factors.  Have your blood pressure checked regularly, as often as told by your health care provider. Working with your health care provider  Review all the medicines you take with your health care provider because there may be side effects or interactions.  Talk with your health care provider about your diet, exercise habits, and other lifestyle factors that may be contributing to hypertension.  Visit your health care provider regularly. Your health care provider can help you create and adjust your plan for managing hypertension. Will I need medicine to control my blood pressure? Your health care provider may prescribe medicine if lifestyle changes are not enough to get your blood pressure under control,  and if:  Your systolic blood pressure is 130 or higher.  Your diastolic blood pressure is 80 or higher.  Take medicines only as told by your health care provider. Follow the directions carefully. Blood pressure medicines must be taken as prescribed. The medicine does not work as well when you skip doses. Skipping doses also puts you at risk for problems. Contact a health care provider if:  You think you are having a reaction to medicines you have taken.  You have repeated (recurrent) headaches.  You feel dizzy.  You have swelling in your ankles.  You have trouble with your vision. Get help right away if:  You develop a severe headache or confusion.  You have unusual weakness or numbness, or you feel faint.  You have severe pain in your chest or abdomen.  You vomit repeatedly.  You have trouble breathing. Summary  Hypertension is when the force of blood pumping through your arteries is too strong. If this condition is not controlled, it may put you at risk for serious complications.  Your personal target blood pressure may vary depending on your medical conditions, your age, and other factors. For most people, a normal blood pressure is less than 120/80.  Hypertension is managed by lifestyle changes, medicines, or both. Lifestyle changes include weight loss, eating a healthy, low-sodium diet, exercising more, and limiting alcohol. This information is not intended to replace advice given to you by your health care provider. Make sure you discuss any questions you have with your health care provider. Document Released: 04/25/2012 Document Revised: 06/29/2016 Document Reviewed: 06/29/2016 Elsevier Interactive Patient Education  2018 [place] A goiter is an enlarged thyroid gland. The thyroid gland is located in the lower front of the neck. The gland produces hormones that regulate mood, body temperature, pulse rate, and digestion. Most goiters are painless and are  not a cause for serious concern. Goiters and conditions that cause goiters can be treated, if necessary. What are the causes? Causes of this condition include:  Diseases that attack healthy cells in your body (autoimmune diseases) and affect your thyroid function, such as: ? Graves disease. This causes too much thyroid hormone to be produced and it makes your thyroid overly active (hyperthyroidism). ? Hashimoto disease. This type of inflammation of the thyroid (thyroiditis) causes too little thyroid hormone to be produced and it makes your thyroid not active enough (hypothyroidism).  Other conditions that cause thyroiditis.  Nodular goiter. This means that there are one or more small growths on your thyroid. These can create too much thyroid hormone.  Pregnancy.  Thyroid cancer. This is rare.  Certain medicines.  Radiation exposure.  Iodine deficiency.  In some cases, the cause may not be known (idiopathic). What increases the risk? This condition is more likely to develop in:  People who have a family history of goiter.  Women.  People who do not get enough iodine in their diet.  People who are older than 70.  People who smoke tobacco.  What are the signs or symptoms? Common symptoms of this condition include:  Swelling in the lower part of the neck. This swelling can range from a very small bump to a large lump.  A tight feeling in the throat.  A hoarse voice.  Other symptoms include:  Coughing.  Wheezing.  Difficulty swallowing.  Difficulty breathing.  Bulging neck veins.  Dizziness.  In some cases, there are no symptoms and thyroid hormone levels may be normal. When a goiter is the result of hyperthyroidism, symptoms may also include:  Nervousness or restlessness.  Inability to tolerate heat.  Unexplained weight loss.  Diarrhea.  Change in the texture of hair or skin.  Changes in heart beat, such as skipped beats, extra beats, or a rapid  heart rate.  Loss of menstruation.  Shaky hands.  Increased appetite.  Sleep problems.  When a goiter is the result of hypothyroidism, symptoms may also include:  Feeling like you have no energy (lethargy).  Inability to tolerate cold.  Weight gain that  is not explained by a change in diet or exercise habits.  Dry skin.  Coarse hair.  Menstrual irregularity.  Constipation.  Sadness or depression.  How is this diagnosed? This condition may be diagnosed with a medical history and physical exam. You may also have other tests, including:  Blood tests to check thyroid function.  Imaging tests, such as: ? Ultrasonography. ? CT scan. ? MRI. ? Thyroid scan. You will be given a safe radioactive injection, then images will be taken of your thyroid.  Tissue sample (biopsy) of the goiter or any nodules. This checks to see if the goiter or nodules are cancerous.  How is this treated? Treatment for this condition depends on the cause. Treatment may include:  Medicines to control your thyroid.  Anti-inflammatory or steroid medicines, if inflammation is the cause.  Iodine supplements or changes in diet, if the goiter is caused by iodine deficiency.  Radiation therapy.  Surgery to remove your thyroid.  In some cases, no treatment is necessary, and your health care provider will monitor your condition at regular checkups. Follow these instructions at home:  Follow recommendations from your health care provider for any changes to your diet.  Take over-the-counter and prescription medicines only as told by your health care provider.  Do not use any tobacco products, including cigarettes, chewing tobacco, or e-cigarettes. If you need help quitting, ask your health care provider.  Keep all follow-up appointments as told by your health care provider. This is important. Contact a health care provider if:  Your symptoms do not get better with treatment. Get help right away  if:  You develop sudden, unexplained confusion or other mental changes.  You have nausea, vomiting, or diarrhea.  You develop a fever.  Your skin or the whites of your eyes appear yellow (jaundice).  You develop chest pain.  You have trouble breathing or swallowing.  You suddenly become very weak.  You experience extreme restlessness. This information is not intended to replace advice given to you by your health care provider. Make sure you discuss any questions you have with your health care provider. Document Released: 01/19/2010 Document Revised: 02/19/2016 Document Reviewed: 07/28/2014 Elsevier Interactive Patient Education  [employer].

## 2018-03-02 NOTE — Telephone Encounter (Signed)
Called patient and informed her of pap smear results and need for colposcopy. Explained procedure to patient & reason for follow up. Patient verbalized understanding and will await phone call from front office staff for an appt.

## 2018-03-02 NOTE — Telephone Encounter (Signed)
Patient called and left message on nurse voicemail line asking what recent labs she had done because she is at her PCP appt. Called patient, no answer- left message stating we are trying to reach you to return your phone call, please call us back if you still need assistance.

## 2018-03-06 ENCOUNTER — Other Ambulatory Visit: Payer: Self-pay

## 2018-03-06 ENCOUNTER — Encounter (HOSPITAL_COMMUNITY): Payer: Self-pay | Admitting: Emergency Medicine

## 2018-03-06 ENCOUNTER — Ambulatory Visit: Payer: BLUE CROSS/BLUE SHIELD

## 2018-03-06 ENCOUNTER — Ambulatory Visit (HOSPITAL_COMMUNITY)
Admission: EM | Admit: 2018-03-06 | Discharge: 2018-03-06 | Disposition: A | Discharge status: Home / Self Care | Payer: BLUE CROSS/BLUE SHIELD | Attending: Family Medicine | Admitting: Family Medicine

## 2018-03-06 DIAGNOSIS — L02212 Cutaneous abscess of back [any part, except buttock]: Secondary | ICD-10-CM | POA: Diagnosis not present

## 2018-03-06 MED ORDER — DOXYCYCLINE HYCLATE 100 MG PO CAPS: 100.0000 mg | ORAL_CAPSULE | Freq: Two times a day (BID) | ORAL | 0 refills | Status: AC

## 2018-03-06 MED ORDER — HYDROCODONE-ACETAMINOPHEN 5-325 MG PO TABS: 1.0000 | ORAL_TABLET | Freq: Four times a day (QID) | ORAL | 0 refills | Status: DC | PRN

## 2018-03-06 MED ORDER — IBUPROFEN 800 MG PO TABS: 800.0000 mg | ORAL_TABLET | Freq: Three times a day (TID) | ORAL | 0 refills | Status: DC

## 2018-03-06 NOTE — ED Provider Notes (Signed)
[facility]    CSN: [id number] Arrival date & time: 03/06/18  4098     History   Chief Complaint Chief Complaint  Patient presents with  . Abscess    HPI Stacey Reynolds is a 27 y.o. female history of hypertension and obesity presenting today for evaluation of an abscess.  Patient states that she has had an abscess on her left back for approximately 5 to 6 days.  Her pain is continued to worsen.  States that it was slightly draining yesterday.  She has had recurrent issues with abscesses on various parts of her body, but notes that she typically gets these around her cycle.  Typically they drain on their own and heal up on their own.  Denies fevers.  HPI  Past Medical History:  Diagnosis Date  . Hypertension   . Obesity     Patient Active Problem List   Diagnosis Date Noted  . Goiter 03/02/2018  . Essential hypertension 03/02/2018  . DUB (dysfunctional uterine bleeding) 09/23/2015  . Anemia due to blood loss, chronic 08/29/2015    Past Surgical History:  Procedure Laterality Date  . TONSILLECTOMY      OB History   None      Home Medications    Prior to Admission medications   Medication Sig Start Date End Date Taking? Authorizing Provider  Aspirin-Caffeine (BAYER BACK & BODY) 500-32.5 MG TABS Take 1 tablet by mouth 2 (two) times daily as needed (Pain).   Yes [provider]  lisinopril-hydrochlorothiazide (ZESTORETIC) 10-12.5 MG tablet Take 1 tablet by mouth daily. 02/20/18  Yes Willodean Rosenthal, MD  norgestrel-ethinyl estradiol (LO/OVRAL,CRYSELLE) 0.3-30 MG-MCG tablet Take 1 tablet by mouth daily. 02/20/18  Yes Willodean Rosenthal, MD  doxycycline (VIBRAMYCIN) 100 MG capsule Take 1 capsule (100 mg total) by mouth 2 (two) times daily for 10 days. 03/06/18 03/16/18  Wieters, Hallie C, PA-C  HYDROcodone-acetaminophen (NORCO/VICODIN) 5-325 MG tablet Take 1 tablet by mouth every 6 (six) hours as needed. 03/06/18   Wieters, Hallie C, PA-C    ibuprofen (ADVIL,MOTRIN) 800 MG tablet Take 1 tablet (800 mg total) by mouth 3 (three) times daily. 03/06/18   Wieters, Junius Creamer, PA-C    Family History Family History  Problem Relation Age of Onset  . Cancer Father     Social History Social History   Tobacco Use  . Smoking status: Never Smoker  . Smokeless tobacco: Never Used  Substance Use Topics  . Alcohol use: Yes    Comment: occasionally  . Drug use: No     Allergies   Patient has no known allergies.   Review of Systems Review of Systems  Constitutional: Negative for fatigue and fever.  Eyes: Negative for visual disturbance.  Respiratory: Negative for shortness of breath.   Cardiovascular: Negative for chest pain.  Gastrointestinal: Negative for abdominal pain, nausea and vomiting.  Musculoskeletal: Negative for arthralgias and joint swelling.  Skin: Positive for color change. Negative for rash and wound.       Abscess  Neurological: Negative for dizziness, weakness, light-headedness and headaches.     Physical Exam Triage Vital Signs ED Triage Vitals  Enc Vitals Group     BP 03/06/18 0855 (!) 151/78     Pulse Rate 03/06/18 0855 100     Resp 03/06/18 0855 20     Temp 03/06/18 0855 98.6 F (37 C)     Temp Source 03/06/18 0855 Oral     SpO2 03/06/18 0855 100 %  Weight --      Height --      Head Circumference --      Peak Flow --      Pain Score 03/06/18 0856 9     Pain Loc --      Pain Edu? --      Excl. in GC? --    No data found.  Updated Vital Signs BP (!) 151/78 (BP Location: Left Arm)   Pulse 100   Temp 98.6 F (37 C) (Oral)   Resp 20   LMP 02/15/2018 (Exact Date)   SpO2 100%   Visual Acuity Right Eye Distance:   Left Eye Distance:   Bilateral Distance:    Right Eye Near:   Left Eye Near:    Bilateral Near:     Physical Exam  Constitutional: She is oriented to person, place, and time. She appears well-developed and well-nourished.  No acute distress  HENT:  Head:  Normocephalic and atraumatic.  Nose: Nose normal.  Eyes: Conjunctivae are normal.  Neck: Neck supple.  Cardiovascular: Normal rate.  Pulmonary/Chest: Effort normal. No respiratory distress.  Abdominal: She exhibits no distension.  Musculoskeletal: Normal range of motion.  Neurological: She is alert and oriented to person, place, and time.  Skin: Skin is warm and dry.  Large 5 to 6 cm x 4 cm area of induration and erythema to left thoracic back, central area of fluctuance that has come to ahead with slight pustular drainage  Psychiatric: She has a normal mood and affect.  Nursing note and vitals reviewed.    UC Treatments / Results  Labs (all labs ordered are listed, but only abnormal results are displayed) Labs Reviewed - No data to display  EKG None  Radiology No results found.  Procedures Incision and Drainage Date/Time: 03/06/2018 10:10 AM Performed by: Wieters, Junius Creamer, PA-C Authorized by: Eustace Moore, MD   Consent:    Consent obtained:  Verbal   Consent given by:  Patient   Risks discussed:  Bleeding, incomplete drainage and pain Location:    Type:  Abscess   Size:  5 cm   Location:  Trunk   Trunk location:  Back Pre-procedure details:    Skin preparation:  Betadine Anesthesia (see MAR for exact dosages):    Anesthesia method:  Local infiltration   Local anesthetic:  Lidocaine 2% w/o epi Procedure type:    Complexity:  Simple Procedure details:    Needle aspiration: no     Incision types:  Single straight   Incision depth:  Subcutaneous   Scalpel blade:  11   Wound management:  Probed and deloculated   Drainage:  Bloody and purulent   Drainage amount:  Copious   Wound treatment:  Wound left open   Packing materials:  1/2 in iodoform gauze Post-procedure details:    Patient tolerance of procedure:  Tolerated well, no immediate complications Comments:     Patient began to feel slightly warm during procedure, but symptoms improved with drinking  water as well as termination of procedure   (including critical care time)  Medications Ordered in UC Medications - No data to display  Initial Impression / Assessment and Plan / UC Course  I have reviewed the triage vital signs and the nursing notes.  Pertinent labs & imaging results that were available during my care of the patient were reviewed by me and considered in my medical decision making (see chart for details).     I&D performed, significant amount  of drainage expressed, packing placed.  Remove in 24 to 48 hours.  Begin doxycycline.  Warm compresses/soaks.  Tylenol and ibuprofen for mild to moderate pain.  Did provide 6 tablets of hydrocodone to use for severe pain over the next day before packing is removed.  Advised not to drive or work while taking.  Continue to monitor for improvement of symptoms.  Patient has recurrent issues with abscess, advised to follow-up with surgery for discussion of possible surgery.Discussed strict return precautions. Patient verbalized understanding and is agreeable with plan.  Final Clinical Impressions(s) / UC Diagnoses   Final diagnoses:  Abscess of back     Discharge Instructions     Please begin doxycycline for 10 days  Apply warm compresses/hot rags to area with massage to express further drainage especially the first 24-48 hours  Return if symptoms returning or not improving  May follow up with [facility] for further evaluation/discussion of possibly removing problematic areas or recurrent abscesses   ED Prescriptions    Medication Sig Dispense Auth. Provider   doxycycline (VIBRAMYCIN) 100 MG capsule Take 1 capsule (100 mg total) by mouth 2 (two) times daily for 10 days. 20 capsule Wieters, Hallie C, PA-C   ibuprofen (ADVIL,MOTRIN) 800 MG tablet Take 1 tablet (800 mg total) by mouth 3 (three) times daily. 21 tablet Wieters, Hallie C, PA-C   HYDROcodone-acetaminophen (NORCO/VICODIN) 5-325 MG tablet Take 1 tablet by  mouth every 6 (six) hours as needed. 6 tablet Wieters, [place] C, PA-C     Controlled Substance Prescriptions [place] Controlled Substance Registry consulted? Yes, I have consulted the [place] Controlled Substances Registry for this patient, and feel the risk/benefit ratio today is favorable for proceeding with this prescription for a controlled substance.   Lew DawesWieters, Hallie C, PA-C 03/06/18 1013

## 2018-03-06 NOTE — Discharge Instructions (Signed)
Please begin doxycycline for 10 days  Apply warm compresses/hot rags to area with massage to express further drainage especially the first 24-48 hours  Return if symptoms returning or not improving  [facility] for further evaluation/discussion of possibly removing problematic areas or recurrent abscesses

## 2018-03-06 NOTE — ED Triage Notes (Signed)
[facility] a [facility] possible abscess on her back x 6 days.

## 2018-04-10 ENCOUNTER — Ambulatory Visit: Payer: BLUE CROSS/BLUE SHIELD | Admitting: Obstetrics & Gynecology

## 2018-04-11 ENCOUNTER — Other Ambulatory Visit: Payer: Self-pay

## 2018-04-11 NOTE — Telephone Encounter (Signed)
Pt called for refill on her blood pressure Rx.

## 2018-04-11 NOTE — Telephone Encounter (Signed)
Notify pt that she should contact her PCP for the refill on BP medication.  Pt stated understanding.

## 2018-04-12 ENCOUNTER — Other Ambulatory Visit: Payer: Self-pay

## 2018-04-12 ENCOUNTER — Other Ambulatory Visit: Payer: Self-pay | Admitting: Family Medicine

## 2018-04-12 ENCOUNTER — Other Ambulatory Visit: Payer: Self-pay | Admitting: *Deleted

## 2018-04-12 DIAGNOSIS — I10 Essential (primary) hypertension: Secondary | ICD-10-CM

## 2018-04-12 MED ORDER — LISINOPRIL-HYDROCHLOROTHIAZIDE 10-12.5 MG PO TABS: 1.0000 | ORAL_TABLET | Freq: Every day | ORAL | 1 refills | Status: DC

## 2018-04-12 NOTE — Telephone Encounter (Signed)
Call to patient- patient scheduled follow up for BP check. She will need refill to get her through her appointment. Discussed the reason why she needs to come- she will bring her records with her for PCP review.  Rx refill request: lisinopril - hydrochlorothiazide 10-12.5 mg     Outside provider last filled: 02/20/18  LOV: 03/02/18 ( patient to continue BP medication and come for F/U)  PCP: [facility]  Pharmacy: verified

## 2018-04-12 NOTE — Telephone Encounter (Unsigned)
Copied from CRM [id number]. Topic: Quick Communication - Rx Refill/Question >> Apr 12, 2018  1:04 PM Mcneil, Ja-Kwan wrote: Medication: lisinopril-hydrochlorothiazide (ZESTORETIC) 10-12.5 MG tablet  Has the patient contacted their pharmacy? yes   Preferred Pharmacy (with phone number or street name): [facility], [place] - [phone] (Phone) [phone] (Fax)  Agent: Please be advised that RX refills may take up to 3 business days. We ask that you follow-up with your pharmacy.

## 2018-04-12 NOTE — Telephone Encounter (Signed)
Pt Rx has been sent to her pharmacy for refill as requested

## 2018-04-26 ENCOUNTER — Ambulatory Visit (INDEPENDENT_AMBULATORY_CARE_PROVIDER_SITE_OTHER): Payer: BLUE CROSS/BLUE SHIELD | Admitting: Family Medicine

## 2018-04-26 ENCOUNTER — Encounter: Payer: Self-pay | Admitting: Family Medicine

## 2018-04-26 VITALS — BP 138/98 | HR 78 | Temp 98.3°F | Wt 343.0 lb

## 2018-04-26 DIAGNOSIS — I10 Essential (primary) hypertension: Secondary | ICD-10-CM

## 2018-04-26 MED ORDER — LISINOPRIL-HYDROCHLOROTHIAZIDE 20-12.5 MG PO TABS: 1.0000 | ORAL_TABLET | Freq: Every day | ORAL | 3 refills | Status: DC

## 2018-04-26 MED ORDER — AMLODIPINE BESYLATE 5 MG PO TABS: 5.0000 mg | ORAL_TABLET | Freq: Every day | ORAL | 3 refills | Status: DC

## 2018-04-26 NOTE — Patient Instructions (Signed)
Managing Your Hypertension Hypertension is commonly called high blood pressure. This is when the force of your blood pressing against the walls of your arteries is too strong. Arteries are blood vessels that carry blood from your heart throughout your body. Hypertension forces the heart to work harder to pump blood, and may cause the arteries to become narrow or stiff. Having untreated or uncontrolled hypertension can cause heart attack, stroke, kidney disease, and other problems. What are blood pressure readings? A blood pressure reading consists of a higher number over a lower number. Ideally, your blood pressure should be below 120/80. The first ("top") number is called the systolic pressure. It is a measure of the pressure in your arteries as your heart beats. The second ("bottom") number is called the diastolic pressure. It is a measure of the pressure in your arteries as the heart relaxes. What does my blood pressure reading mean? Blood pressure is classified into four stages. Based on your blood pressure reading, your health care provider may use the following stages to determine what type of treatment you need, if any. Systolic pressure and diastolic pressure are measured in a unit called mm Hg. Normal  Systolic pressure: below 120.  Diastolic pressure: below 80. Elevated  Systolic pressure: 120-129.  Diastolic pressure: below 80. Hypertension stage 1  Systolic pressure: 130-139.  Diastolic pressure: 80-89. Hypertension stage 2  Systolic pressure: 140 or above.  Diastolic pressure: 90 or above. What health risks are associated with hypertension? Managing your hypertension is an important responsibility. Uncontrolled hypertension can lead to:  A heart attack.  A stroke.  A weakened blood vessel (aneurysm).  Heart failure.  Kidney damage.  Eye damage.  Metabolic syndrome.  Memory and concentration problems.  What changes can I make to manage my  hypertension? Hypertension can be managed by making lifestyle changes and possibly by taking medicines. Your health care provider will help you make a plan to bring your blood pressure within a normal range. Eating and drinking  Eat a diet that is high in fiber and potassium, and low in salt (sodium), added sugar, and fat. An example eating plan is called the DASH (Dietary Approaches to Stop Hypertension) diet. To eat this way: ? Eat plenty of fresh fruits and vegetables. Try to fill half of your plate at each meal with fruits and vegetables. ? Eat whole grains, such as whole wheat pasta, brown rice, or whole grain bread. Fill about one quarter of your plate with whole grains. ? Eat low-fat diary products. ? Avoid fatty cuts of meat, processed or cured meats, and poultry with skin. Fill about one quarter of your plate with lean proteins such as fish, chicken without skin, beans, eggs, and tofu. ? Avoid premade and processed foods. These tend to be higher in sodium, added sugar, and fat.  Reduce your daily sodium intake. Most people with hypertension should eat less than 1,500 mg of sodium a day.  Limit alcohol intake to no more than 1 drink a day for nonpregnant women and 2 drinks a day for men. One drink equals 12 oz of beer, 5 oz of wine, or 1 oz of hard liquor. Lifestyle  Work with your health care provider to maintain a healthy body weight, or to lose weight. Ask what an ideal weight is for you.  Get at least 30 minutes of exercise that causes your heart to beat faster (aerobic exercise) most days of the week. Activities may include walking, swimming, or biking.  Include exercise   to strengthen your muscles (resistance exercise), such as weight lifting, as part of your weekly exercise routine. Try to do these types of exercises for 30 minutes at least 3 days a week.  Do not use any products that contain nicotine or tobacco, such as cigarettes and e-cigarettes. If you need help quitting, ask  your health care provider.  Control any long-term (chronic) conditions you have, such as high cholesterol or diabetes. Monitoring  Monitor your blood pressure at home as told by your health care provider. Your personal target blood pressure may vary depending on your medical conditions, your age, and other factors.  Have your blood pressure checked regularly, as often as told by your health care provider. Working with your health care provider  Review all the medicines you take with your health care provider because there may be side effects or interactions.  Talk with your health care provider about your diet, exercise habits, and other lifestyle factors that may be contributing to hypertension.  Visit your health care provider regularly. Your health care provider can help you create and adjust your plan for managing hypertension. Will I need medicine to control my blood pressure? Your health care provider may prescribe medicine if lifestyle changes are not enough to get your blood pressure under control, and if:  Your systolic blood pressure is 130 or higher.  Your diastolic blood pressure is 80 or higher.  Take medicines only as told by your health care provider. Follow the directions carefully. Blood pressure medicines must be taken as prescribed. The medicine does not work as well when you skip doses. Skipping doses also puts you at risk for problems. Contact a health care provider if:  You think you are having a reaction to medicines you have taken.  You have repeated (recurrent) headaches.  You feel dizzy.  You have swelling in your ankles.  You have trouble with your vision. Get help right away if:  You develop a severe headache or confusion.  You have unusual weakness or numbness, or you feel faint.  You have severe pain in your chest or abdomen.  You vomit repeatedly.  You have trouble breathing. Summary  Hypertension is when the force of blood pumping through  your arteries is too strong. If this condition is not controlled, it may put you at risk for serious complications.  Your personal target blood pressure may vary depending on your medical conditions, your age, and other factors. For most people, a normal blood pressure is less than 120/80.  Hypertension is managed by lifestyle changes, medicines, or both. Lifestyle changes include weight loss, eating a healthy, low-sodium diet, exercising more, and limiting alcohol. This information is not intended to replace advice given to you by your health care provider. Make sure you discuss any questions you have with your health care provider. Document Released: 04/25/2012 Document Revised: 06/29/2016 Document Reviewed: 06/29/2016 Elsevier Interactive Patient Education  2018 [employer]. Hydrochlorothiazide, HCTZ; Lisinopril tablets What is this medicine? HYDROCHLOROTHIAZIDE; LISINOPRIL (hye droe klor oh THYE a zide; lyse IN oh pril) is a combination of a diuretic and an ACE inhibitor. It is used to treat high blood pressure. This medicine may be used for other purposes; ask your health care provider or pharmacist if you have questions. COMMON BRAND NAME(S): Prinzide, Zestoretic What should I tell my health care provider before I take this medicine? They need to know if you have any of these conditions: -bone marrow disease -decreased urine -heart or blood vessel disease -if  you are on a special diet like a low salt diet -immune system problems, like lupus -kidney disease -liver disease -previous swelling of the tongue, face, or lips with difficulty breathing, difficulty swallowing, hoarseness, or tightening of the throat -recent heart attack or stroke -an unusual or allergic reaction to lisinopril, hydrochlorothiazide, sulfa drugs, other medicines, insect venom, foods, dyes, or preservatives -pregnant or trying to get pregnant -breast-feeding How should I use this medicine? Take this medicine by  mouth with a glass of water. Follow the directions on the prescription label. You can take it with or without food. If it upsets your stomach, take it with food. Take your medicine at regular intervals. Do not take it more often than directed. Do not stop taking except on your doctor's advice. Talk to your pediatrician regarding the use of this medicine in children. Special care may be needed. Overdosage: If you think you have taken too much of this medicine contact a poison control center or emergency room at once. NOTE: This medicine is only for you. Do not share this medicine with others. What if I miss a dose? If you miss a dose, take it as soon as you can. If it is almost time for your next dose, take only that dose. Do not take double or extra doses. What may interact with this medicine? Do not take this medication with any of the following medications: -sacubitril; valsartan This medicine may also interact with the following: -barbiturates like phenobarbital -blood pressure medicines -corticosteroids like prednisone -diabetic medications -diuretics, especially triamterene, spironolactone or amiloride -lithium -NSAIDs, medicines for pain and inflammation, like ibuprofen or naproxen -potassium salts or potassium supplements -prescription pain medicines -skeletal muscle relaxants like tubocurarine -some cholesterol lowering medications like cholestyramine or colestipol This list may not describe all possible interactions. Give your health care provider a list of all the medicines, herbs, non-prescription drugs, or dietary supplements you use. Also tell them if you smoke, drink alcohol, or use illegal drugs. Some items may interact with your medicine. What should I watch for while using this medicine? Visit your doctor or health care professional for regular checks on your progress. Check your blood pressure as directed. Ask your doctor or health care professional what your blood pressure  should be and when you should contact him or her. Call your doctor or health care professional if you notice an irregular or fast heart beat. You must not get dehydrated. Ask your doctor or health care professional how much fluid you need to drink a day. Check with him or her if you get an attack of severe diarrhea, nausea and vomiting, or if you sweat a lot. The loss of too much body fluid can make it dangerous for you to take this medicine. Women should inform their doctor if they wish to become pregnant or think they might be pregnant. There is a potential for serious side effects to an unborn child. Talk to your health care professional or pharmacist for more information. You may get drowsy or dizzy. Do not drive, use machinery, or do anything that needs mental alertness until you know how this drug affects you. Do not stand or sit up quickly, especially if you are an older patient. This reduces the risk of dizzy or fainting spells. Alcohol can make you more drowsy and dizzy. Avoid alcoholic drinks. This medicine may affect your blood sugar level. If you have diabetes, check with your doctor or health care professional before changing the dose of your diabetic medicine.  Avoid salt substitutes unless you are told otherwise by your doctor or health care professional. This medicine can make you more sensitive to the sun. Keep out of the sun. If you cannot avoid being in the sun, wear protective clothing and use sunscreen. Do not use sun lamps or tanning beds/booths. Do not treat yourself for coughs, colds, or pain while you are taking this medicine without asking your doctor or health care professional for advice. Some ingredients may increase your blood pressure. What side effects may I notice from receiving this medicine? Side effects that you should report to your doctor or health care professional as soon as possible: -changes in vision -confusion, dizziness, light headedness or fainting  spells -decreased amount of urine passed -difficulty breathing or swallowing, hoarseness, or tightening of the throat -eye pain -fast or irregular heart beat, palpitations, or chest pain -muscle cramps -nausea and vomiting -persistent dry cough -redness, blistering, peeling or loosening of the skin, including inside the mouth -stomach pain -swelling of your face, lips, tongue, hands, or feet -unusual rash, bleeding or bruising, or pinpoint red spots on the skin -worsened gout pain -yellowing of the eyes or skin Side effects that usually do not require medical attention (report to your doctor or health care professional if they continue or are bothersome): -change in sex drive or performance -cough -headache This list may not describe all possible side effects. Call your doctor for medical advice about side effects. You may report side effects to FDA at 1-800-FDA-1088. Where should I keep my medicine? Keep out of the reach of children. Store at room temperature between 20 and 25 degrees C (68 and 77 degrees F). Protect from moisture and excessive light. Keep container tightly closed. Throw away any unused medicine after the expiration date. NOTE: This sheet is a summary. It may not cover all possible information. If you have questions about this medicine, talk to your doctor, pharmacist, or health care provider.  2018 Elsevier/Gold Standard (2015-09-25 11:42:20)

## 2018-04-26 NOTE — Progress Notes (Signed)
Subjective:    Patient ID: Stacey Reynolds, female    DOB: 03-22-1991, 27 y.o.   MRN: [id number]  No chief complaint on file.   HPI Patient was seen today for bp check.  Pt states since last OFV she has been out of lisinopril-HCTZ x 2 wks.  Pt states she called in about a refill however never heard back.  Pt notes a HA when starting the med and then developing a dry cough.  Pt states sys bp has been 130s-140s.  Pt was keeping track of her bp in her phone, but had to get a new phone recently.  Pt denies HAs, SOB, CP, blurred vision.  Past Medical History:  Diagnosis Date  . Hypertension   . Obesity     No Known Allergies  ROS General: Denies fever, chills, night sweats, changes in weight, changes in appetite HEENT: Denies headaches, ear pain, changes in vision, rhinorrhea, sore throat CV: Denies CP, palpitations, SOB, orthopnea Pulm: Denies SOB, cough, wheezing GI: Denies abdominal pain, nausea, vomiting, diarrhea, constipation GU: Denies dysuria, hematuria, frequency, vaginal discharge Msk: Denies muscle cramps, joint pains Neuro: Denies weakness, numbness, tingling Skin: Denies rashes, bruising Psych: Denies depression, anxiety, hallucinations     Objective:    Blood pressure (!) 138/98, pulse 78, temperature 98.3 F (36.8 C), temperature source Oral, weight (!) 343 lb (155.6 kg), SpO2 96 %.   Gen. Pleasant, well-nourished, in no distress, normal affect   HEENT: [place]/AT, face symmetric, no scleral icterus, PERRLA, nares patent without drainage Lungs: no accessory muscle use, CTAB, no wheezes or rales Cardiovascular: RRR, no peripheral edema Neuro:  A&Ox3, CN II-XII intact, normal gait Skin:  Warm, no lesions/ rash  Wt Readings from Last 3 Encounters:  03/02/18 (!) 342 lb (155.1 kg)  02/20/18 (!) 340 lb (154.2 kg)  01/16/17 (!) 415 lb (188.2 kg)    Lab Results  Component Value Date   WBC 8.2 01/16/2017   HGB 11.8 01/16/2017   HCT 36.5 01/16/2017   PLT 478 (H)  01/16/2017   GLUCOSE 102 (H) 12/24/2015   ALT 113 (H) 12/24/2015   AST 59 (H) 12/24/2015   NA 140 12/24/2015   K 3.9 12/24/2015   CL 109 12/24/2015   CREATININE 0.79 12/24/2015   BUN 9 12/24/2015   CO2 23 12/24/2015   TSH 2.71 09/23/2015   HGBA1C 5.4 01/16/2017    Assessment/Plan:  Essential hypertension  -Will d/c lisinopril-hctz 2/2 dry cough -will start norvasc 5 mg daily -lifestyle modifications encouraged. -pt to continue checking bp at home - Plan: amLODipine (NORVASC) 5 MG tablet, DISCONTINUED: lisinopril-hydrochlorothiazide (PRINZIDE,ZESTORETIC) 20-12.5 MG tablet  F/u in 1 month  Abbe AmsterdamShannon Nuvia Hileman, MD

## 2018-05-04 ENCOUNTER — Encounter (HOSPITAL_COMMUNITY): Payer: Self-pay | Admitting: Emergency Medicine

## 2018-05-04 ENCOUNTER — Ambulatory Visit (HOSPITAL_COMMUNITY)
Admission: EM | Admit: 2018-05-04 | Discharge: 2018-05-04 | Disposition: A | Discharge status: Home / Self Care | Payer: BLUE CROSS/BLUE SHIELD | Attending: Family Medicine | Admitting: Family Medicine

## 2018-05-04 DIAGNOSIS — Z3202 Encounter for pregnancy test, result negative: Secondary | ICD-10-CM

## 2018-05-04 DIAGNOSIS — R102 Pelvic and perineal pain: Secondary | ICD-10-CM | POA: Diagnosis not present

## 2018-05-04 DIAGNOSIS — Z79899 Other long term (current) drug therapy: Secondary | ICD-10-CM | POA: Diagnosis not present

## 2018-05-04 DIAGNOSIS — I10 Essential (primary) hypertension: Secondary | ICD-10-CM | POA: Diagnosis not present

## 2018-05-04 DIAGNOSIS — Z7982 Long term (current) use of aspirin: Secondary | ICD-10-CM | POA: Insufficient documentation

## 2018-05-04 DIAGNOSIS — N3090 Cystitis, unspecified without hematuria: Secondary | ICD-10-CM | POA: Insufficient documentation

## 2018-05-04 DIAGNOSIS — R030 Elevated blood-pressure reading, without diagnosis of hypertension: Secondary | ICD-10-CM

## 2018-05-04 DIAGNOSIS — R300 Dysuria: Secondary | ICD-10-CM

## 2018-05-04 LAB — POCT URINALYSIS DIP (DEVICE)
Bilirubin Urine: NEGATIVE
Glucose, UA: NEGATIVE mg/dL
Hgb urine dipstick: NEGATIVE
Ketones, ur: NEGATIVE mg/dL
Nitrite: POSITIVE — AB
Protein, ur: NEGATIVE mg/dL
Specific Gravity, Urine: 1.025 (ref 1.005–1.030)
Urobilinogen, UA: 1 mg/dL (ref 0.0–1.0)
pH: 6 (ref 5.0–8.0)

## 2018-05-04 LAB — POCT PREGNANCY, URINE: Preg Test, Ur: NEGATIVE

## 2018-05-04 MED ORDER — SULFAMETHOXAZOLE-TRIMETHOPRIM 800-160 MG PO TABS: 1.0000 | ORAL_TABLET | Freq: Two times a day (BID) | ORAL | 0 refills | Status: DC

## 2018-05-04 NOTE — ED Triage Notes (Signed)
PT C/O: UTI sx onset 2 days associated w/dysuria, abd pain, foul smell,   DENIES: fevers, n/v, chills  TAKING MEDS: not for urinary sx but just started taking BP meds x1 week  Sexually active... Reports she usually gets UTIs after SI.   A&O x4... NAD... Ambulatory

## 2018-05-04 NOTE — ED Provider Notes (Signed)
MRN: [id number] DOB: July 28, 1991  Subjective:   Stacey Reynolds is a 27 y.o. female presenting for 2 day history of dysuria, pelvic pain and cloudy malordorous urine. Has not tried medications for relief. Hydrates very well. Denies fever, hematuria, urinary urgency, flank pain and vaginal discharge, chills, nausea and vomiting. Denies history of history of kidney disease. Patient has a history of HTN, is being managed with amlodipine and is being managed by her PCP. Has f/u in 1 month and is working very closely with her PCP on this including weight loss.  reports that she has never smoked. She has never used smokeless tobacco. She reports that she drinks alcohol. She reports that she does not use drugs.    No current facility-administered medications for this encounter.   Current Outpatient Medications:  .  amLODipine (NORVASC) 5 MG tablet, Take 1 tablet (5 mg total) by mouth daily., Disp: 30 tablet, Rfl: 3 .  norgestrel-ethinyl estradiol (LO/OVRAL,CRYSELLE) 0.3-30 MG-MCG tablet, Take 1 tablet by mouth daily., Disp: 1 Package, Rfl: 3 .  Aspirin-Caffeine (BAYER BACK & BODY) 500-32.5 MG TABS, Take 1 tablet by mouth 2 (two) times daily as needed (Pain)., Disp: , Rfl:  .  HYDROcodone-acetaminophen (NORCO/VICODIN) 5-325 MG tablet, Take 1 tablet by mouth every 6 (six) hours as needed., Disp: 6 tablet, Rfl: 0 .  ibuprofen (ADVIL,MOTRIN) 800 MG tablet, Take 1 tablet (800 mg total) by mouth 3 (three) times daily., Disp: 21 tablet, Rfl: 0   Allergies  Allergen Reactions  . Lisinopril     Dry cough    Past Medical History:  Diagnosis Date  . Hypertension   . Obesity      Past Surgical History:  Procedure Laterality Date  . TONSILLECTOMY      Objective:   Vitals: BP (!) 161/107 (BP Location: Left Arm)   Pulse 83   Temp 97.8 F (36.6 C) (Oral)   Resp 18   LMP 04/06/2018   SpO2 100%   BP Readings from Last 3 Encounters:  05/04/18 (!) 161/107  04/26/18 (!) 138/98  03/06/18 (!)  151/78   Physical Exam  Constitutional: She is oriented to person, place, and time. She appears well-developed and well-nourished.  Cardiovascular: Normal rate, regular rhythm, normal heart sounds and intact distal pulses. Exam reveals no gallop and no friction rub.  No murmur heard. Pulmonary/Chest: Effort normal and breath sounds normal. No stridor. No respiratory distress. She has no wheezes. She has no rales.  Abdominal: Soft. Bowel sounds are normal. She exhibits no distension and no mass. There is no tenderness. There is no rebound and no guarding.  No CVA tenderness.  Musculoskeletal: She exhibits no edema.  Neurological: She is alert and oriented to person, place, and time.  Skin: Skin is warm and dry. No rash noted. No erythema. No pallor.  Psychiatric: She has a normal mood and affect.    Results for orders placed or performed during the hospital encounter of 05/04/18 (from the past 24 hour(s))  POCT urinalysis dip (device)     Status: Abnormal   Collection Time: 05/04/18  7:47 PM  Result Value Ref Range   Glucose, UA NEGATIVE NEGATIVE mg/dL   Bilirubin Urine NEGATIVE NEGATIVE   Ketones, ur NEGATIVE NEGATIVE mg/dL   Specific Gravity, Urine 1.025 1.005 - 1.030   Hgb urine dipstick NEGATIVE NEGATIVE   pH 6.0 5.0 - 8.0   Protein, ur NEGATIVE NEGATIVE mg/dL   Urobilinogen, UA 1.0 0.0 - 1.0 mg/dL   Nitrite POSITIVE (A)  NEGATIVE   Leukocytes, UA TRACE (A) NEGATIVE  Pregnancy, urine POC     Status: None   Collection Time: 05/04/18  7:51 PM  Result Value Ref Range   Preg Test, Ur NEGATIVE NEGATIVE    Assessment and Plan :   Cystitis  Dysuria  Essential hypertension  Elevated blood pressure reading  We will use Bactrim, urine culture pending.  Discussed management of frequent UTIs.  Patient is working very closely with her PCP on her hypertension and is asymptomatic right now.  Return to clinic precautions reviewed.  Maintain close follow-up with PCP.   Wallis Bamberg,  [place] 05/04/18 1958

## 2018-05-07 LAB — URINE CULTURE: Culture: >=100000 — AB

## 2018-05-14 ENCOUNTER — Ambulatory Visit: Payer: BLUE CROSS/BLUE SHIELD | Admitting: Obstetrics & Gynecology

## 2018-06-01 ENCOUNTER — Ambulatory Visit (INDEPENDENT_AMBULATORY_CARE_PROVIDER_SITE_OTHER): Payer: BLUE CROSS/BLUE SHIELD | Admitting: Obstetrics & Gynecology

## 2018-06-01 DIAGNOSIS — R87612 Low grade squamous intraepithelial lesion on cytologic smear of cervix (LGSIL): Secondary | ICD-10-CM

## 2018-06-01 NOTE — Progress Notes (Signed)
Pt was scheduled for a colpo for LGSIL After review of chart with pt, colpo not indicated. Pt has had no prev abnormal PAPs. She will wait and f/u in 1 year for a repeat PAP. All questions were answered.   Total face-to-face time with patient was 5-7 min.  Greater than 50% was spent in counseling and coordination of care with the patient.    Jhalen Eley L. Harraway-Smith, M.D., [facility]

## 2018-06-01 NOTE — Patient Instructions (Signed)

## 2018-09-10 ENCOUNTER — Telehealth: Payer: Self-pay | Admitting: Family Medicine

## 2018-09-10 NOTE — Telephone Encounter (Signed)
Need a refill on norgestrel-ethinyl estradiol

## 2018-09-12 ENCOUNTER — Other Ambulatory Visit: Payer: Self-pay

## 2018-09-12 DIAGNOSIS — N938 Other specified abnormal uterine and vaginal bleeding: Secondary | ICD-10-CM

## 2018-09-12 DIAGNOSIS — Z3009 Encounter for other general counseling and advice on contraception: Secondary | ICD-10-CM

## 2018-09-12 MED ORDER — NORGESTREL-ETHINYL ESTRADIOL 0.3-30 MG-MCG PO TABS: 1.0000 | ORAL_TABLET | Freq: Every day | ORAL | 3 refills | Status: DC

## 2018-09-12 NOTE — Progress Notes (Signed)
Patient has been seen within 1 year per protocol sent in birthcontrol pills

## 2018-11-28 ENCOUNTER — Other Ambulatory Visit: Payer: Self-pay

## 2018-11-28 DIAGNOSIS — I10 Essential (primary) hypertension: Secondary | ICD-10-CM

## 2018-11-28 DIAGNOSIS — Z3009 Encounter for other general counseling and advice on contraception: Secondary | ICD-10-CM

## 2018-11-28 MED ORDER — NORGESTREL-ETHINYL ESTRADIOL 0.3-30 MG-MCG PO TABS: 1.0000 | ORAL_TABLET | Freq: Every day | ORAL | 3 refills | Status: DC

## 2018-11-28 NOTE — Telephone Encounter (Signed)
Received [facility] from [facility] to re-fill  Cryselle-28 tablet.

## 2018-12-05 ENCOUNTER — Telehealth: Payer: Self-pay | Admitting: Obstetrics & Gynecology

## 2018-12-05 NOTE — Telephone Encounter (Signed)
Patient stated she needed 2 more refills sent in order for her insurance to cover. Requesting Rx be sent today.

## 2018-12-05 NOTE — Telephone Encounter (Signed)
I contacted [facility] regarding Rx for Cryselle OCP's and spoke with pharmacist. She stated that the prescription which was originally sent in on 4/13 needed additional refills. This was updated on 4/15 and no additional refills are now required.

## 2019-01-29 ENCOUNTER — Telehealth: Payer: Self-pay | Admitting: Family Medicine

## 2019-01-29 NOTE — Telephone Encounter (Signed)
Called pharmacy and verified that patient had refills available, called patient and advised to go to pharmacy to pick up Rx

## 2019-01-29 NOTE — Telephone Encounter (Signed)
Patient called said her Pharmacy sent over a Refill Rx and shebeen out of her [facility] for a week, patient need this medicine today  LO/OVRAL,CRYSELLE she need 3 refill in order for her insurance to cover it.

## 2019-05-01 ENCOUNTER — Telehealth: Payer: Self-pay | Admitting: Obstetrics and Gynecology

## 2019-05-01 NOTE — Telephone Encounter (Signed)
Three medication refills has to be sent for the insurance to cover. [facility] [address] [facility] [place], [place] norgestrel-ethinyl estradiol (Lo/Ovral, Cryselle)

## 2019-05-03 ENCOUNTER — Other Ambulatory Visit: Payer: Self-pay | Admitting: Lactation Services

## 2019-05-03 DIAGNOSIS — Z3009 Encounter for other general counseling and advice on contraception: Secondary | ICD-10-CM

## 2019-05-03 MED ORDER — NORGESTREL-ETHINYL ESTRADIOL 0.3-30 MG-MCG PO TABS: 1.0000 | ORAL_TABLET | Freq: Every day | ORAL | 2 refills | Status: DC

## 2019-05-03 NOTE — Progress Notes (Signed)
Pr requesting OCP's be refilled. Orders sent and pt was informed via phone that she needs to make an appt for yearly exam.

## 2019-06-19 ENCOUNTER — Ambulatory Visit: Payer: BLUE CROSS/BLUE SHIELD | Admitting: Advanced Practice Midwife

## 2019-08-01 ENCOUNTER — Telehealth: Payer: Self-pay | Admitting: *Deleted

## 2019-08-01 NOTE — Telephone Encounter (Signed)
Received a fax requesting refill of Cryselle-28 tablets. Per chart review seen last in office 05/2018 for colposcopy, 07/19 well woman exam. Given refill of Cryselle for 3 packs 09/12/18, 11/28/18., and 05/03/19 . At 05/03/19 was told needs to make appointment for yearly exam.  [facility] annual exam in November. Also noted to have hypertension. Per protocol can be refilled once for 53months. Will forward to provider.  Shadie Sweatman,RN

## 2019-08-02 NOTE — Telephone Encounter (Signed)
I called [facility] and left message we are not refilling the  prescription - she will need to be seen in the office first.   I also called [facility] and left a voicemail we got a refill request for one of your prescriptions and we will not be able to refill this until she is seen in the office again. Please call our office to schedule appointment and or if you have questions. Linda,RN

## 2019-08-02 NOTE — Telephone Encounter (Signed)
No, would not refill due to hypertension. She needs a visit with a provider to discuss a safer method of contraception for her.   -KMD

## 2019-10-13 ENCOUNTER — Other Ambulatory Visit: Payer: Self-pay

## 2019-10-13 ENCOUNTER — Ambulatory Visit (HOSPITAL_COMMUNITY)
Admission: EM | Admit: 2019-10-13 | Discharge: 2019-10-13 | Disposition: A | Discharge status: Home / Self Care | Payer: 59 | Attending: Emergency Medicine | Admitting: Emergency Medicine

## 2019-10-13 ENCOUNTER — Encounter (HOSPITAL_COMMUNITY): Payer: Self-pay

## 2019-10-13 DIAGNOSIS — N390 Urinary tract infection, site not specified: Secondary | ICD-10-CM | POA: Diagnosis present

## 2019-10-13 DIAGNOSIS — Z3202 Encounter for pregnancy test, result negative: Secondary | ICD-10-CM

## 2019-10-13 DIAGNOSIS — R350 Frequency of micturition: Secondary | ICD-10-CM | POA: Diagnosis not present

## 2019-10-13 LAB — POCT PREGNANCY, URINE: Preg Test, Ur: NEGATIVE

## 2019-10-13 LAB — POCT URINALYSIS DIP (DEVICE)
Bilirubin Urine: NEGATIVE
Glucose, UA: NEGATIVE mg/dL
Ketones, ur: NEGATIVE mg/dL
Nitrite: NEGATIVE
Protein, ur: 100 mg/dL — AB
Specific Gravity, Urine: 1.01 (ref 1.005–1.030)
Urobilinogen, UA: 0.2 mg/dL (ref 0.0–1.0)
pH: 7.5 (ref 5.0–8.0)

## 2019-10-13 LAB — POC URINE PREG, ED: Preg Test, Ur: NEGATIVE

## 2019-10-13 MED ORDER — SULFAMETHOXAZOLE-TRIMETHOPRIM 800-160 MG PO TABS: 1.0000 | ORAL_TABLET | Freq: Two times a day (BID) | ORAL | 0 refills | Status: DC

## 2019-10-13 NOTE — ED Provider Notes (Signed)
[facility]    CSN: [id number] Arrival date & time: 10/13/19  1009      History   Chief Complaint Chief Complaint  Patient presents with  . Urinary Tract Infection    HPI Stacey Reynolds is a 29 y.o. female.   Lower RLQ abd pain with burning on urination. states that she has a chronic hx of uti. Stopped birth control in Jan states that she has not had a cycle since she thinks it is due to she has been on [facility] for so many years. Had intercourse a few weeks ago. Does not think she can be pregnant. Denies any n/v/d. Denies any vaginal discharge. Does not think that it could be an STI. Denies any hx of diabetes or kidney problems. Does have HTN and has an appointment to see MD for refill on meds.      Past Medical History:  Diagnosis Date  . Hypertension   . Obesity     Patient Active Problem List   Diagnosis Date Noted  . Goiter 03/02/2018  . Essential hypertension 03/02/2018  . DUB (dysfunctional uterine bleeding) 09/23/2015  . Anemia due to blood loss, chronic 08/29/2015    Past Surgical History:  Procedure Laterality Date  . TONSILLECTOMY      OB History   No obstetric history on file.      Home Medications    Prior to Admission medications   Medication Sig Start Date End Date Taking? Authorizing Provider  amLODipine (NORVASC) 5 MG tablet Take 1 tablet (5 mg total) by mouth daily. 04/26/18   Billie Ruddy, MD  Aspirin-Caffeine (BAYER BACK & BODY) 500-32.5 MG TABS Take 1 tablet by mouth 2 (two) times daily as needed (Pain).    [provider]  HYDROcodone-acetaminophen (NORCO/VICODIN) 5-325 MG tablet Take 1 tablet by mouth every 6 (six) hours as needed. 03/06/18   Wieters, Hallie C, PA-C  ibuprofen (ADVIL,MOTRIN) 800 MG tablet Take 1 tablet (800 mg total) by mouth 3 (three) times daily. 03/06/18   Wieters, Hallie C, PA-C  norgestrel-ethinyl estradiol (LO/OVRAL) 0.3-30 MG-MCG tablet Take 1 tablet by mouth daily. 05/03/19   Sloan Leiter,  MD  sulfamethoxazole-trimethoprim (BACTRIM DS) 800-160 MG tablet Take 1 tablet by mouth 2 (two) times daily. 10/13/19   Marney Setting, NP    Family History Family History  Problem Relation Age of Onset  . Healthy Mother   . Cancer Father     Social History Social History   Tobacco Use  . Smoking status: Never Smoker  . Smokeless tobacco: Never Used  Substance Use Topics  . Alcohol use: Yes    Comment: occasionally  . Drug use: No     Allergies   Lisinopril   Review of Systems Review of Systems  Constitutional: Negative.   Respiratory: Negative.   Cardiovascular: Negative.   Gastrointestinal: Positive for abdominal pain.  Endocrine: Negative.   Genitourinary: Positive for frequency and urgency.  Skin: Negative.      Physical Exam Triage Vital Signs ED Triage Vitals  Enc Vitals Group     BP 10/13/19 1034 101/69     Pulse Rate 10/13/19 1034 84     Resp 10/13/19 1034 (!) 22     Temp 10/13/19 1034 98.4 F (36.9 C)     Temp Source 10/13/19 1034 Oral     SpO2 10/13/19 1034 100 %     Weight 10/13/19 1031 (!) 401 lb 12.8 oz (182.3 kg)  Height --      Head Circumference --      Peak Flow --      Pain Score 10/13/19 1031 7     Pain Loc --      Pain Edu? --      Excl. in GC? --    No data found.  Updated Vital Signs BP 101/69 (BP Location: Left Arm)   Pulse 84   Temp 98.4 F (36.9 C) (Oral)   Resp (!) 22   Wt (!) 401 lb 12.8 oz (182.3 kg)   LMP 08/09/2019   SpO2 100%   BMI 62.93 kg/m   Visual Acuity Right Eye Distance:   Left Eye Distance:   Bilateral Distance:    Right Eye Near:   Left Eye Near:    Bilateral Near:     Physical Exam Constitutional:      Appearance: She is obese.  Cardiovascular:     Rate and Rhythm: Normal rate.  Pulmonary:     Effort: Pulmonary effort is normal.  Abdominal:     Tenderness: There is abdominal tenderness. There is no right CVA tenderness or left CVA tenderness.     Comments: RLQ  Musculoskeletal:         General: Normal range of motion.  Skin:    General: Skin is warm.  Neurological:     Mental Status: She is alert.      UC Treatments / Results  Labs (all labs ordered are listed, but only abnormal results are displayed) Labs Reviewed  POCT URINALYSIS DIP (DEVICE) - Abnormal; Notable for the following components:      Result Value   Hgb urine dipstick LARGE (*)    Protein, ur 100 (*)    Leukocytes,Ua LARGE (*)    All other components within normal limits  URINE CULTURE  POC URINE PREG, ED  POCT PREGNANCY, URINE    EKG   Radiology No results found.  Procedures Procedures (including critical care time)  Medications Ordered in UC Medications - No data to display  Initial Impression / Assessment and Plan / UC Course  I have reviewed the triage vital signs and the nursing notes.  Pertinent labs & imaging results that were available during my care of the patient were reviewed by me and considered in my medical decision making (see chart for details).    Will send off urine culture if anything is positive on results they will call you to alter the treatment plan.  Use protection with intercourse.  Take full dose of medications Stay hydrated well  Take medications with food  Reviewed past test results and has had ecoli culture in the past on 04/2018 Final Clinical Impressions(s) / UC Diagnoses   Final diagnoses:  Lower urinary tract infectious disease  Urinary frequency     Discharge Instructions     Will send off urine culture if anything is positive on results they will call you to alter the treatment plan.  Use protection with intercourse.  Take full dose of medications Stay hydrated well  Take medications with food     ED Prescriptions    Medication Sig Dispense Auth. Provider   sulfamethoxazole-trimethoprim (BACTRIM DS) 800-160 MG tablet Take 1 tablet by mouth 2 (two) times daily. 14 tablet Coralyn Mark, NP     PDMP not reviewed this  encounter.   Coralyn Mark, NP 10/13/19 1058

## 2019-10-13 NOTE — ED Triage Notes (Signed)
Pt is here with burning when urinating, states she has a hx of UTI's. Pt has not used any meds to relieve discomfort.

## 2019-10-13 NOTE — Discharge Instructions (Addendum)
Will send off urine culture if anything is positive on results they will call you to alter the treatment plan.  Use protection with intercourse.  Take full dose of medications Stay hydrated well  Take medications with food

## 2019-10-15 LAB — URINE CULTURE: Culture: >=100000 — AB

## 2020-09-17 ENCOUNTER — Other Ambulatory Visit: Payer: Self-pay

## 2020-09-17 ENCOUNTER — Encounter (HOSPITAL_COMMUNITY): Payer: Self-pay

## 2020-09-17 ENCOUNTER — Ambulatory Visit (HOSPITAL_COMMUNITY)
Admission: EM | Admit: 2020-09-17 | Discharge: 2020-09-17 | Disposition: A | Discharge status: Home / Self Care | Payer: No Typology Code available for payment source | Attending: Student | Admitting: Student

## 2020-09-17 DIAGNOSIS — R040 Epistaxis: Secondary | ICD-10-CM | POA: Diagnosis not present

## 2020-09-17 DIAGNOSIS — I10 Essential (primary) hypertension: Secondary | ICD-10-CM

## 2020-09-17 DIAGNOSIS — Z793 Long term (current) use of hormonal contraceptives: Secondary | ICD-10-CM | POA: Diagnosis not present

## 2020-09-17 DIAGNOSIS — Z79899 Other long term (current) drug therapy: Secondary | ICD-10-CM | POA: Diagnosis not present

## 2020-09-17 DIAGNOSIS — J069 Acute upper respiratory infection, unspecified: Secondary | ICD-10-CM | POA: Diagnosis not present

## 2020-09-17 DIAGNOSIS — U071 COVID-19: Secondary | ICD-10-CM | POA: Diagnosis not present

## 2020-09-17 DIAGNOSIS — Z791 Long term (current) use of non-steroidal anti-inflammatories (NSAID): Secondary | ICD-10-CM | POA: Insufficient documentation

## 2020-09-17 DIAGNOSIS — J029 Acute pharyngitis, unspecified: Secondary | ICD-10-CM | POA: Diagnosis present

## 2020-09-17 LAB — SARS CORONAVIRUS 2 (TAT 6-24 HRS): SARS Coronavirus 2: POSITIVE — AB

## 2020-09-17 LAB — POCT RAPID STREP A, ED / UC: Streptococcus, Group A Screen (Direct): NEGATIVE

## 2020-09-17 MED ORDER — BENZONATATE 100 MG PO CAPS: 100.0000 mg | ORAL_CAPSULE | Freq: Three times a day (TID) | ORAL | 0 refills | Status: DC

## 2020-09-17 MED ORDER — LIDOCAINE VISCOUS HCL 2 % MT SOLN: 15.0000 mL | OROMUCOSAL | 0 refills | Status: DC | PRN

## 2020-09-17 MED ORDER — PROMETHAZINE-DM 6.25-15 MG/5ML PO SYRP: 5.0000 mL | ORAL_SOLUTION | Freq: Four times a day (QID) | ORAL | 0 refills | Status: DC | PRN

## 2020-09-17 MED ORDER — IBUPROFEN 800 MG PO TABS: 800.0000 mg | ORAL_TABLET | Freq: Three times a day (TID) | ORAL | 0 refills | Status: DC

## 2020-09-17 NOTE — ED Provider Notes (Signed)
[facility]    CSN: [id number] Arrival date & time: 09/17/20  0805      History   Chief Complaint Chief Complaint  Patient presents with  . Sore Throat    HPI Stacey Reynolds is a 30 y.o. female presenting with sore throat x5 days. History tonsillectomy, hypertension taking no medications for this, morbid obesity. Endorses 5 days of sore throat, productive cough, nasal congestion, fevers, tachycardia. Denies chest pain, shortness of breath, difficulty breathing, n/v/d, headaches, vision changes. Patient states her symptoms began about an hour after eating a new type of grapes. She denies multiple times difficulty breathing, shortness of breath, facial/throat swelling. Blowing her nose so much that she had one episode of epistaxis that resolved on its own. Not taking any medications for symptoms.   HPI  Past Medical History:  Diagnosis Date  . Hypertension   . Obesity     Patient Active Problem List   Diagnosis Date Noted  . Goiter 03/02/2018  . Essential hypertension 03/02/2018  . DUB (dysfunctional uterine bleeding) 09/23/2015  . Anemia due to blood loss, chronic 08/29/2015    Past Surgical History:  Procedure Laterality Date  . TONSILLECTOMY      OB History   No obstetric history on file.      Home Medications    Prior to Admission medications   Medication Sig Start Date End Date Taking? Authorizing Provider  amLODipine (NORVASC) 5 MG tablet Take 1 tablet (5 mg total) by mouth daily. 04/26/18   Deeann Saint, MD  Aspirin-Caffeine (BAYER BACK & BODY) 500-32.5 MG TABS Take 1 tablet by mouth 2 (two) times daily as needed (Pain).    [provider]  HYDROcodone-acetaminophen (NORCO/VICODIN) 5-325 MG tablet Take 1 tablet by mouth every 6 (six) hours as needed. 03/06/18   Wieters, Hallie C, PA-C  ibuprofen (ADVIL,MOTRIN) 800 MG tablet Take 1 tablet (800 mg total) by mouth 3 (three) times daily. 03/06/18   Wieters, Hallie C, PA-C   norgestrel-ethinyl estradiol (LO/OVRAL) 0.3-30 MG-MCG tablet Take 1 tablet by mouth daily. 05/03/19   Conan Bowens, MD  sulfamethoxazole-trimethoprim (BACTRIM DS) 800-160 MG tablet Take 1 tablet by mouth 2 (two) times daily. 10/13/19   Coralyn Mark, NP    Family History Family History  Problem Relation Age of Onset  . Healthy Mother   . Cancer Father     Social History Social History   Tobacco Use  . Smoking status: Never Smoker  . Smokeless tobacco: Never Used  Substance Use Topics  . Alcohol use: Yes    Comment: occasionally  . Drug use: No     Allergies   Lisinopril   Review of Systems Review of Systems  Constitutional: Positive for chills. Negative for appetite change and fever.  HENT: Positive for congestion and sore throat. Negative for ear pain, rhinorrhea, sinus pressure and sinus pain.   Eyes: Negative for redness and visual disturbance.  Respiratory: Positive for cough. Negative for chest tightness, shortness of breath and wheezing.   Cardiovascular: Negative for chest pain and palpitations.  Gastrointestinal: Negative for abdominal pain, constipation, diarrhea, nausea and vomiting.  Genitourinary: Negative for dysuria, frequency and urgency.  Musculoskeletal: Negative for myalgias.  Neurological: Negative for dizziness, weakness and headaches.  Psychiatric/Behavioral: Negative for confusion.  All other systems reviewed and are negative.    Physical Exam Triage Vital Signs ED Triage Vitals  Enc Vitals Group     BP 09/17/20 0838 (!) 160/115  Pulse Rate 09/17/20 0838 (!) 116     Resp 09/17/20 0838 18     Temp 09/17/20 0841 100.2 F (37.9 C)     Temp Source 09/17/20 0841 Oral     SpO2 09/17/20 0838 100 %     Weight --      Height --      Head Circumference --      Peak Flow --      Pain Score 09/17/20 0838 9     Pain Loc --      Pain Edu? --      Excl. in GC? --    No data found.  Updated Vital Signs BP (!) 160/115 (BP Location:  Left Arm)   Pulse (!) 116   Temp 100.2 F (37.9 C) (Oral)   Resp 18   LMP 08/31/2020 (Approximate)   SpO2 100%   Visual Acuity Right Eye Distance:   Left Eye Distance:   Bilateral Distance:    Right Eye Near:   Left Eye Near:    Bilateral Near:     Physical Exam Vitals reviewed.  Constitutional:      General: She is not in acute distress.    Appearance: Normal appearance. She is not ill-appearing.  HENT:     Head: Normocephalic and atraumatic.     Right Ear: Hearing, tympanic membrane, ear canal and external ear normal. No swelling or tenderness. There is no impacted cerumen. No mastoid tenderness. Tympanic membrane is not perforated, erythematous, retracted or bulging.     Left Ear: Hearing, tympanic membrane, ear canal and external ear normal. No swelling or tenderness. There is no impacted cerumen. No mastoid tenderness. Tympanic membrane is not perforated, erythematous, retracted or bulging.     Nose:     Right Sinus: No maxillary sinus tenderness or frontal sinus tenderness.     Left Sinus: No maxillary sinus tenderness or frontal sinus tenderness.     Mouth/Throat:     Mouth: Mucous membranes are moist.     Pharynx: Uvula midline. Posterior oropharyngeal erythema present. No oropharyngeal exudate.     Tonsils: No tonsillar exudate. 1+ on the right. 1+ on the left.     Comments: Tonsils 1+ bilaterally. Smooth erythema posterior pharynx. Uvula midline. No pharyngeal swelling, no lip/tongue/face swelling.  Cardiovascular:     Rate and Rhythm: Normal rate and regular rhythm.     Heart sounds: Normal heart sounds.  Pulmonary:     Breath sounds: Normal breath sounds and air entry. No wheezing, rhonchi or rales.  Chest:     Chest wall: No tenderness.  Abdominal:     General: Abdomen is flat. Bowel sounds are normal.     Tenderness: There is no abdominal tenderness. There is no guarding or rebound.  Lymphadenopathy:     Cervical: No cervical adenopathy.  Neurological:      General: No focal deficit present.     Mental Status: She is alert and oriented to person, place, and time.  Psychiatric:        Attention and Perception: Attention and perception normal.        Mood and Affect: Mood and affect normal.        Behavior: Behavior normal. Behavior is cooperative.        Thought Content: Thought content normal.        Judgment: Judgment normal.      UC Treatments / Results  Labs (all labs ordered are listed, but only abnormal results are displayed)  Labs Reviewed  CULTURE, GROUP A STREP [facility])  POCT RAPID STREP A, ED / UC    EKG   Radiology No results found.  Procedures Procedures (including critical care time)  Medications Ordered in UC Medications - No data to display  Initial Impression / Assessment and Plan / UC Course  I have reviewed the triage vital signs and the nursing notes.  Pertinent labs & imaging results that were available during my care of the patient were reviewed by me and considered in my medical decision making (see chart for details).     Patient with history of tonsillectomy. Rapid strep negative today. Culture sent.  Covid sent.  Pt is febrile, tachycardic, oxygenating well on room air, no wheezes rhonchi or rales. Given multiple URI symptoms, suspect viral URI rather than allergic reaction to grapes.  Lidocaine mouthwash for sore throat.  For elevated blood pressure reading- pt has been diagnosed with hypertension in the past but is taking no medications for this. rec rechecking BP at home or pharmacy. F/u with PCP for improved BP control.  Discussed management of epistaxis.  Strict return precautions- if new onset of throat/tongue/facial swelling, shortness of breath, chest pain, etc - seek IMMEDIATE medical attention. Patient verbalizes understanding and agreement.    Spent over 40 minutes obtaining H&P, performing physical, discussing results, treatment plan and plan for follow-up with patient. Patient agrees with  plan.    Final Clinical Impressions(s) / UC Diagnoses   Final diagnoses:  Essential hypertension  Viral URI with cough  Epistaxis   Discharge Instructions   None    ED Prescriptions    None     PDMP not reviewed this encounter.   Rhys Martini, PA-C 09/17/20 1003

## 2020-09-17 NOTE — Discharge Instructions (Addendum)
-  For sore throat, use lidocaine mouthwash up to every 4 hours. Make sure not to eat for at least 1 hour after using this, as your mouth will be very numb and you could bite yourself. -Promethazine DM cough syrup for congestion/cough. This could make you drowsy, so take at night before bed.  -Tessalon as needed for cough. Take one pill up to 3x daily (every 8 hours) -For fevers/chills, body aches, headaches- use Tylenol and Ibuprofen. You can alternate these for maximum effect. Use up to 3000mg  Tylenol daily and 800mg  Ibuprofen up to 3x daily. Make sure to take ibuprofen with food. Check the bottle of ibuprofen/tylenol for specific dosage instructions. -We are currently awaiting result of your PCR covid-19 test. This typically comes back in 1-2 days. We'll call you if the result is positive. Otherwise, the result will be sent electronically to your MyChart. You can also call this clinic and ask for your result via telephone.

## 2020-09-17 NOTE — ED Triage Notes (Signed)
Pt presents with a sore throat x 5 days. She states she started having a sore throat after eating grapes. She states she had nose bleeds this morning.

## 2020-09-18 ENCOUNTER — Telehealth: Payer: Self-pay

## 2020-09-18 NOTE — Telephone Encounter (Signed)
Called to discuss with patient about COVID-19 symptoms and the use of one of the available treatments for those with mild to moderate Covid symptoms and at a high risk of hospitalization.  Pt appears to qualify for outpatient treatment due to co-morbid conditions and/or a member of an at-risk group in accordance with the FDA Emergency Use Authorization.    Symptom onset: 09/12/20 per ED note. Vaccinated: None in chart Booster? None in chart Immunocompromised? No Qualifiers: Obesity, HTN  Unable to reach pt - Left message and call back number [phone].   Esther Hardy

## 2020-09-19 LAB — CULTURE, GROUP A STREP (THRC)

## 2020-10-01 ENCOUNTER — Encounter (HOSPITAL_COMMUNITY): Payer: Self-pay

## 2020-10-01 ENCOUNTER — Ambulatory Visit (HOSPITAL_COMMUNITY)
Admission: EM | Admit: 2020-10-01 | Discharge: 2020-10-01 | Disposition: A | Discharge status: Home / Self Care | Payer: No Typology Code available for payment source | Attending: Student | Admitting: Student

## 2020-10-01 ENCOUNTER — Emergency Department (HOSPITAL_COMMUNITY)
Admission: EM | Admit: 2020-10-01 | Discharge: 2020-10-02 | Disposition: A | Discharge status: Home / Self Care | Payer: No Typology Code available for payment source | Attending: Emergency Medicine | Admitting: Emergency Medicine

## 2020-10-01 ENCOUNTER — Encounter (HOSPITAL_COMMUNITY): Payer: Self-pay | Admitting: Emergency Medicine

## 2020-10-01 ENCOUNTER — Other Ambulatory Visit: Payer: Self-pay

## 2020-10-01 ENCOUNTER — Emergency Department (HOSPITAL_COMMUNITY): Payer: No Typology Code available for payment source

## 2020-10-01 DIAGNOSIS — I10 Essential (primary) hypertension: Secondary | ICD-10-CM | POA: Insufficient documentation

## 2020-10-01 DIAGNOSIS — Z7982 Long term (current) use of aspirin: Secondary | ICD-10-CM | POA: Diagnosis not present

## 2020-10-01 DIAGNOSIS — R0602 Shortness of breath: Secondary | ICD-10-CM | POA: Diagnosis not present

## 2020-10-01 DIAGNOSIS — U071 COVID-19: Secondary | ICD-10-CM

## 2020-10-01 DIAGNOSIS — R062 Wheezing: Secondary | ICD-10-CM

## 2020-10-01 DIAGNOSIS — Z79899 Other long term (current) drug therapy: Secondary | ICD-10-CM | POA: Diagnosis not present

## 2020-10-01 DIAGNOSIS — R9431 Abnormal electrocardiogram [ECG] [EKG]: Secondary | ICD-10-CM

## 2020-10-01 DIAGNOSIS — H66002 Acute suppurative otitis media without spontaneous rupture of ear drum, left ear: Secondary | ICD-10-CM | POA: Diagnosis not present

## 2020-10-01 DIAGNOSIS — I161 Hypertensive emergency: Secondary | ICD-10-CM | POA: Diagnosis not present

## 2020-10-01 LAB — BASIC METABOLIC PANEL
Anion gap: 9 (ref 5–15)
BUN: 10 mg/dL (ref 6–20)
CO2: 23 mmol/L (ref 22–32)
Calcium: 9.4 mg/dL (ref 8.9–10.3)
Chloride: 108 mmol/L (ref 98–111)
Creatinine, Ser: 0.67 mg/dL (ref 0.44–1.00)
GFR, Estimated: >60 mL/min (ref >60)
Glucose, Bld: 93 mg/dL (ref 70–99)
Potassium: 3.3 mmol/L — ABNORMAL LOW (ref 3.5–5.1)
Sodium: 140 mmol/L (ref 135–145)

## 2020-10-01 LAB — CBC
HCT: 34.6 % — ABNORMAL LOW (ref 36.0–46.0)
Hemoglobin: 12.6 g/dL (ref 12.0–15.0)
MCH: 32.1 pg (ref 26.0–34.0)
MCHC: 36.4 g/dL — ABNORMAL HIGH (ref 30.0–36.0)
MCV: 88.3 fL (ref 80.0–100.0)
Platelets: 403 10*3/uL — ABNORMAL HIGH (ref 150–400)
RBC: 3.92 MIL/uL (ref 3.87–5.11)
RDW: 19.3 % — ABNORMAL HIGH (ref 11.5–15.5)
WBC: 9.4 10*3/uL (ref 4.0–10.5)
nRBC: 0 % (ref 0.0–0.2)

## 2020-10-01 LAB — TROPONIN I (HIGH SENSITIVITY): Troponin I (High Sensitivity): 5 ng/L (ref <18)

## 2020-10-01 LAB — I-STAT BETA HCG BLOOD, ED (MC, WL, AP ONLY): I-stat hCG, quantitative: <5 m[IU]/mL (ref <5)

## 2020-10-01 MED ORDER — PREDNISONE 20 MG PO TABS
60.0000 mg | ORAL_TABLET | Freq: Once | ORAL | Status: AC
  Administered 2020-10-01: 60 mg via ORAL
  Filled 2020-10-01: qty 3

## 2020-10-01 MED ORDER — POTASSIUM CHLORIDE CRYS ER 20 MEQ PO TBCR
40.0000 meq | EXTENDED_RELEASE_TABLET | Freq: Once | ORAL | Status: AC
  Administered 2020-10-02: 40 meq via ORAL
  Filled 2020-10-01: qty 2

## 2020-10-01 MED ORDER — ALBUTEROL SULFATE HFA 108 (90 BASE) MCG/ACT IN AERS
2.0000 | INHALATION_SPRAY | Freq: Once | RESPIRATORY_TRACT | Status: AC
  Administered 2020-10-01: 2 via RESPIRATORY_TRACT
  Filled 2020-10-01: qty 6.7

## 2020-10-01 NOTE — ED Provider Notes (Signed)
[facility] Provider Note   CSN: [id number] Arrival date & time: 10/01/20  2030     History Chief Complaint  Patient presents with  . Hypertension  . Abnormal ECG    Stacey Reynolds is a 30 y.o. female.  HPI Stacey Reynolds is a 30 y.o. female with a history of HTN and obesity, who was sent from urgent care for evaluation of wheezing, shortness of breath, hypertension and abnormal EKG. BP was 205/157 at urgent care, prior history but patient is not surrently on any medication. Patient was diagnosed with COVID 2 weeks ago and has continued to have some cough and wheezing intermittently, with shortness of breath. Reports having a brief episode of chest pain last night, but none today. Reports continued wheezing throughout her COVID course that she does not feel is improving. No prior hx of reactive airway disease. No headaches, dizziness, visual changes, numbness weakness, or lower extremity swelling.     Past Medical History:  Diagnosis Date  . Hypertension   . Obesity     Patient Active Problem List   Diagnosis Date Noted  . Goiter 03/02/2018  . Essential hypertension 03/02/2018  . DUB (dysfunctional uterine bleeding) 09/23/2015  . Anemia due to blood loss, chronic 08/29/2015    Past Surgical History:  Procedure Laterality Date  . TONSILLECTOMY       OB History   No obstetric history on file.     Family History  Problem Relation Age of Onset  . Healthy Mother   . Cancer Father     Social History   Tobacco Use  . Smoking status: Never Smoker  . Smokeless tobacco: Never Used  Substance Use Topics  . Alcohol use: Yes    Comment: occasionally  . Drug use: No    Home Medications Prior to Admission medications   Medication Sig Start Date End Date Taking? Authorizing Provider  amLODipine (NORVASC) 5 MG tablet Take 1 tablet (5 mg total) by mouth daily. 04/26/18   Deeann SaintBanks, Shannon R, MD  Aspirin-Caffeine (BAYER  BACK & BODY) 500-32.5 MG TABS Take 1 tablet by mouth 2 (two) times daily as needed (Pain).    [provider]  benzonatate (TESSALON) 100 MG capsule Take 1 capsule (100 mg total) by mouth every 8 (eight) hours. 09/17/20   Rhys MartiniGraham, Laura E, PA-C  HYDROcodone-acetaminophen (NORCO/VICODIN) 5-325 MG tablet Take 1 tablet by mouth every 6 (six) hours as needed. 03/06/18   Wieters, Hallie C, PA-C  ibuprofen (ADVIL) 800 MG tablet Take 1 tablet (800 mg total) by mouth 3 (three) times daily. 09/17/20   Rhys MartiniGraham, Laura E, PA-C  lidocaine (XYLOCAINE) 2 % solution Use as directed 15 mLs in the mouth or throat as needed for mouth pain. 09/17/20   Rhys MartiniGraham, Laura E, PA-C  norgestrel-ethinyl estradiol (LO/OVRAL) 0.3-30 MG-MCG tablet Take 1 tablet by mouth daily. 05/03/19   Conan Bowensavis, Kelly M, MD  promethazine-dextromethorphan (PROMETHAZINE-DM) 6.25-15 MG/5ML syrup Take 5 mLs by mouth 4 (four) times daily as needed for cough. 09/17/20   Rhys MartiniGraham, Laura E, PA-C  sulfamethoxazole-trimethoprim (BACTRIM DS) 800-160 MG tablet Take 1 tablet by mouth 2 (two) times daily. 10/13/19   Coralyn MarkMitchell, Melanie L, NP    Allergies    Lisinopril  Review of Systems   Review of Systems  Constitutional: Negative for chills and fever.  HENT: Negative.   Eyes: Negative for visual disturbance.  Respiratory: Positive for cough, shortness of breath and wheezing.   Cardiovascular: Positive for  chest pain. Negative for palpitations and leg swelling.  Gastrointestinal: Negative for abdominal pain, diarrhea, nausea and vomiting.  Genitourinary: Negative for dysuria and frequency.  Musculoskeletal: Negative for arthralgias and myalgias.  Skin: Negative for color change and rash.  Neurological: Negative for dizziness, syncope, weakness, light-headedness, numbness and headaches.  All other systems reviewed and are negative.   Physical Exam Updated Vital Signs BP (!) 176/146   Pulse (!) 102   Temp 98.4 F (36.9 C) (Oral)   Resp (!) 23   LMP  08/31/2020   SpO2 96%   Physical Exam Vitals and nursing note reviewed.  Constitutional:      General: She is not in acute distress.    Appearance: Normal appearance. She is well-developed and well-nourished. She is obese. She is not ill-appearing or diaphoretic.     Comments: Well-appearing and in no distress  HENT:     Head: Normocephalic and atraumatic.     Mouth/Throat:     Mouth: Oropharynx is clear and moist. Mucous membranes are moist.     Pharynx: Oropharynx is clear.  Eyes:     General:        Right eye: No discharge.        Left eye: No discharge.     Extraocular Movements: EOM normal.     Pupils: Pupils are equal, round, and reactive to light.  Cardiovascular:     Rate and Rhythm: Regular rhythm. Tachycardia present.     Pulses: Intact distal pulses.     Heart sounds: Normal heart sounds. No murmur heard. No friction rub. No gallop.      Comments: Mildly tachycardic on arrival Pulmonary:     Effort: Pulmonary effort is normal. No respiratory distress.     Breath sounds: Wheezing present. No rales.     Comments: Respirations are equal and unlabored, on auscultation there are some faint scattered wheezes noted, good air movement, no rales or rhonchi Abdominal:     General: Bowel sounds are normal. There is no distension.     Palpations: Abdomen is soft. There is no mass.     Tenderness: There is no abdominal tenderness. There is no guarding.     Comments: Abdomen soft, nondistended, nontender to palpation in all quadrants without guarding or peritoneal signs  Musculoskeletal:        General: No deformity or edema.     Cervical back: Neck supple. No rigidity.     Right lower leg: No edema.     Left lower leg: No edema.  Skin:    General: Skin is warm and dry.     Capillary Refill: Capillary refill takes less than 2 seconds.  Neurological:     Mental Status: She is alert.     Coordination: Coordination normal.     Comments: Speech is clear, able to follow  commands Moves extremities without ataxia, coordination intact  Psychiatric:        Mood and Affect: Mood normal.        Behavior: Behavior normal.     ED Results / Procedures / Treatments   Labs (all labs ordered are listed, but only abnormal results are displayed) Labs Reviewed  BASIC METABOLIC PANEL - Abnormal; Notable for the following components:      Result Value   Potassium 3.3 (*)    All other components within normal limits  CBC - Abnormal; Notable for the following components:   HCT 34.6 (*)    MCHC 36.4 (*)  RDW 19.3 (*)    Platelets 403 (*)    All other components within normal limits  D-DIMER, QUANTITATIVE - Abnormal; Notable for the following components:   D-Dimer, Quant 0.67 (*)    All other components within normal limits  I-STAT BETA HCG BLOOD, ED ([facility], [facility], [facility] ONLY)  TROPONIN I (HIGH SENSITIVITY)  TROPONIN I (HIGH SENSITIVITY)    EKG EKG Interpretation  Date/Time:  Thursday October 01 2020 21:48:57 EST Ventricular Rate:  102 PR Interval:  176 QRS Duration: 75 QT Interval:  306 QTC Calculation: 399 R Axis:   86 Text Interpretation: Sinus tachycardia Borderline repolarization abnormality No previous tracing Confirmed by Gilda Crease [phone]) on 10/01/2020 11:17:36 PM   Radiology DG Chest 2 View  Result Date: 10/01/2020 CLINICAL DATA:  COVID-19 diagnosed 09/17/2020, cough, wheezing, short of breath EXAM: CHEST - 2 VIEW COMPARISON:  None. FINDINGS: Frontal and lateral views of the chest demonstrate an unremarkable cardiac silhouette. No airspace disease, effusion, or pneumothorax. No acute bony abnormalities. IMPRESSION: 1. No acute intrathoracic process. Electronically Signed   By: Sharlet Salina M.D.   On: 10/01/2020 21:10   CT Angio Chest PE W and/or Wo Contrast  Result Date: 10/02/2020 CLINICAL DATA:  COVID Feb 3rd. Went to UC this evening c/o cough, wheezing and SOB. Positive D-dimer. Pulmonary embolus suspected. EXAM: CT ANGIOGRAPHY CHEST  WITH CONTRAST TECHNIQUE: Multidetector CT imaging of the chest was performed using the standard protocol during bolus administration of intravenous contrast. Multiplanar CT image reconstructions and MIPs were obtained to evaluate the vascular anatomy. CONTRAST:  46mL OMNIPAQUE IOHEXOL 350 MG/ML SOLN COMPARISON:  Chest x-ray 10/01/2020. FINDINGS: Cardiovascular: Satisfactory opacification of the pulmonary arteries to the segmental level. No evidence of pulmonary embolism. Normal heart size. No pericardial effusion. Mediastinum/Nodes: No enlarged mediastinal, hilar, or axillary lymph nodes. Thyroid gland, trachea, and esophagus demonstrate no significant findings. Lungs/Pleura: No focal consolidation. No pulmonary nodule. No pulmonary mass. No pleural effusion. No pneumothorax. Upper Abdomen: No acute abnormality. Musculoskeletal: No chest wall abnormality. No acute or significant osseous findings. Review of the MIP images confirms the above findings. IMPRESSION: 1. No pulmonary embolus. 2. No acute intrathoracic abnormality. Electronically Signed   By: Tish Frederickson M.D.   On: 10/02/2020 01:26     Procedures Procedures   Medications Ordered in ED Medications  predniSONE (DELTASONE) tablet 60 mg (60 mg Oral Given 10/01/20 2328)  albuterol (VENTOLIN HFA) 108 (90 Base) MCG/ACT inhaler 2 puff (2 puffs Inhalation Given 10/01/20 2326)  potassium chloride SA (KLOR-CON) CR tablet 40 mEq (40 mEq Oral Given 10/02/20 0110)  iohexol (OMNIPAQUE) 350 MG/ML injection 100 mL (100 mLs Intravenous Contrast Given 10/02/20 0103)  amLODipine (NORVASC) tablet 10 mg (10 mg Oral Given 10/02/20 0345)    ED Course  I have reviewed the triage vital signs and the nursing notes.  Pertinent labs & imaging results that were available during my care of the patient were reviewed by me and considered in my medical decision making (see chart for details).    MDM Rules/Calculators/A&P                         30 year old female  presents from urgent care for evaluation of hypertension and abnormal EKG as well as shortness of breath and wheezing.  She was diagnosed with Covid 2 weeks ago and has had continued wheezing without history of reactive airway disease.  Has not been using any medicines for this.  Was seen  at urgent care and noted to be hypertensive with systolic blood pressure in the 200s, reported an episode of chest pain last night, has a history of hypertension but is not currently on any blood pressure medication, was sent to the ED for further evaluation.  Will evaluate with CBC, BMP, troponin, D-dimer, EKG and chest x-ray.  I have independently ordered, reviewed and interpreted all labs and imaging: CBC: No leukocytosis, normal hemoglobin BMP: Potassium of 3.3, p.o. potassium replacement given, no other electrolyte derangements, normal renal function Troponin: Negative x2, no concern for ACS or hypertensive emergency D-dimer: Elevated at 0.67, will proceed with CTA  Chest x-ray with no acute cardiopulmonary disease  EKG at urgent care with some T wave inversions here, sinus tachycardia with signs of early repole but no ischemic changes, no priors available for comparison  CTA with no evidence of PE or other acute intrathoracic abnormality.  After reviewing patient's chart it appears that she was previously on amlodipine for blood pressure, given that her evaluation has been reassuring today and she does not seem to be particularly symptomatic with this hypertension, will start her back on amlodipine, discussed the importance of remaining on this medication.  Pt ill need to follow closely with a PCP for BP management, asked patient to keep a log of blood pressures.   At this time there does not appear to be any evidence of an acute emergency medical condition and the patient appears stable for discharge with appropriate outpatient follow up.Diagnosis was discussed with patient who verbalizes understanding and  is agreeable to discharge..     Final Clinical Impression(s) / ED Diagnoses Final diagnoses:  Hypertension, unspecified type  Wheezing  COVID-19 virus infection    Rx / DC Orders ED Discharge Orders         Ordered    amLODipine (NORVASC) 10 MG tablet  Daily        10/02/20 0247    predniSONE (DELTASONE) 20 MG tablet  Daily        10/02/20 0247           Dartha Lodge, PA-C 10/08/20 2111    Gerhard Munch, MD 10/09/20 2112

## 2020-10-01 NOTE — ED Provider Notes (Signed)
[facility]    CSN: [id number] Arrival date & time: 10/01/20  1906      History   Chief Complaint Chief Complaint  Patient presents with  . Wheezing  . Shortness of Breath  . Cough    HPI Stacey Reynolds is a 30 y.o. female presenting with multiple complaints- shortness of breath, wheezing, cough. Upon triage, it was noted that her blood pressure is significantly elevated at 205/157. This patient was also covid positive 2 weeks ago. -She has a history of hypertension, not currently taking any medications for this. Denies current chest pain but does endorse some last night (less than 1 day ago). Denies headaches, vision changes, dizziness, pain down arms, n/v/d.  -She states that she has continued to experience shortness of breath and wheezing since her diagnosis of covid. Denies history of pulmonary disease.  -Left ear pressure and muffled hearing. Denies dizziness, tinnitus.    HPI  Past Medical History:  Diagnosis Date  . Hypertension   . Obesity     Patient Active Problem List   Diagnosis Date Noted  . Goiter 03/02/2018  . Essential hypertension 03/02/2018  . DUB (dysfunctional uterine bleeding) 09/23/2015  . Anemia due to blood loss, chronic 08/29/2015    Past Surgical History:  Procedure Laterality Date  . TONSILLECTOMY      OB History   No obstetric history on file.      Home Medications    Prior to Admission medications   Medication Sig Start Date End Date Taking? Authorizing Provider  amLODipine (NORVASC) 5 MG tablet Take 1 tablet (5 mg total) by mouth daily. 04/26/18   Deeann Saint, MD  Aspirin-Caffeine (BAYER BACK & BODY) 500-32.5 MG TABS Take 1 tablet by mouth 2 (two) times daily as needed (Pain).    [provider]  benzonatate (TESSALON) 100 MG capsule Take 1 capsule (100 mg total) by mouth every 8 (eight) hours. 09/17/20   Rhys Martini, PA-C  HYDROcodone-acetaminophen (NORCO/VICODIN) 5-325 MG tablet Take 1 tablet  by mouth every 6 (six) hours as needed. 03/06/18   Wieters, Hallie C, PA-C  ibuprofen (ADVIL) 800 MG tablet Take 1 tablet (800 mg total) by mouth 3 (three) times daily. 09/17/20   Rhys Martini, PA-C  lidocaine (XYLOCAINE) 2 % solution Use as directed 15 mLs in the mouth or throat as needed for mouth pain. 09/17/20   Rhys Martini, PA-C  norgestrel-ethinyl estradiol (LO/OVRAL) 0.3-30 MG-MCG tablet Take 1 tablet by mouth daily. 05/03/19   Conan Bowens, MD  promethazine-dextromethorphan (PROMETHAZINE-DM) 6.25-15 MG/5ML syrup Take 5 mLs by mouth 4 (four) times daily as needed for cough. 09/17/20   Rhys Martini, PA-C  sulfamethoxazole-trimethoprim (BACTRIM DS) 800-160 MG tablet Take 1 tablet by mouth 2 (two) times daily. 10/13/19   Coralyn Mark, NP    Family History Family History  Problem Relation Age of Onset  . Healthy Mother   . Cancer Father     Social History Social History   Tobacco Use  . Smoking status: Never Smoker  . Smokeless tobacco: Never Used  Substance Use Topics  . Alcohol use: Yes    Comment: occasionally  . Drug use: No     Allergies   Lisinopril   Review of Systems Review of Systems  Constitutional: Negative for appetite change, chills and fever.  HENT: Positive for ear pain. Negative for congestion, rhinorrhea, sinus pressure, sinus pain and sore throat.   Eyes: Negative for redness and  visual disturbance.  Respiratory: Positive for cough and shortness of breath. Negative for chest tightness and wheezing.   Cardiovascular: Positive for chest pain. Negative for palpitations.  Gastrointestinal: Negative for abdominal pain, constipation, diarrhea, nausea and vomiting.  Genitourinary: Negative for dysuria, frequency and urgency.  Musculoskeletal: Negative for myalgias.  Neurological: Negative for dizziness, weakness and headaches.  Psychiatric/Behavioral: Negative for confusion.  All other systems reviewed and are negative.    Physical Exam Triage  Vital Signs ED Triage Vitals [10/01/20 1925]  Enc Vitals Group     BP (!) 205/157     Pulse Rate (!) 103     Resp 18     Temp 97.9 F (36.6 C)     Temp Source Oral     SpO2 95 %     Weight      Height      Head Circumference      Peak Flow      Pain Score      Pain Loc      Pain Edu?      Excl. in GC?    No data found.  Updated Vital Signs BP (!) 205/157 (BP Location: Left Arm)   Pulse (!) 103   Temp 97.9 F (36.6 C) (Oral)   Resp 18   LMP 08/28/2020 (Approximate)   SpO2 95%   Visual Acuity Right Eye Distance:   Left Eye Distance:   Bilateral Distance:    Right Eye Near:   Left Eye Near:    Bilateral Near:     Physical Exam Vitals reviewed.  Constitutional:      General: She is not in acute distress.    Appearance: Normal appearance. She is obese. She is not ill-appearing.  HENT:     Head: Normocephalic and atraumatic.     Right Ear: Hearing, tympanic membrane, ear canal and external ear normal. No swelling or tenderness. There is no impacted cerumen. No mastoid tenderness. Tympanic membrane is not perforated, erythematous, retracted or bulging.     Left Ear: Hearing, ear canal and external ear normal. No swelling or tenderness. There is no impacted cerumen. No mastoid tenderness. Tympanic membrane is erythematous. Tympanic membrane is not perforated, retracted or bulging.     Nose:     Right Sinus: No maxillary sinus tenderness or frontal sinus tenderness.     Left Sinus: No maxillary sinus tenderness or frontal sinus tenderness.     Mouth/Throat:     Mouth: Mucous membranes are moist.     Pharynx: Uvula midline. No oropharyngeal exudate or posterior oropharyngeal erythema.     Tonsils: No tonsillar exudate.  Eyes:     Extraocular Movements: Extraocular movements intact.     Pupils: Pupils are equal, round, and reactive to light.  Cardiovascular:     Rate and Rhythm: Normal rate and regular rhythm.     Heart sounds: Normal heart sounds.  Pulmonary:      Breath sounds: Normal breath sounds and air entry. No decreased breath sounds, wheezing, rhonchi or rales.  Chest:     Chest wall: No tenderness.  Abdominal:     General: Abdomen is flat. Bowel sounds are normal.     Tenderness: There is no abdominal tenderness. There is no guarding or rebound.  Lymphadenopathy:     Cervical: No cervical adenopathy.  Neurological:     General: No focal deficit present.     Mental Status: She is alert and oriented to person, place, and time.  Comments: CN 2-12 grossly intact  Psychiatric:        Attention and Perception: Attention and perception normal.        Mood and Affect: Mood and affect normal.        Behavior: Behavior normal. Behavior is cooperative.        Thought Content: Thought content normal.        Judgment: Judgment normal.      UC Treatments / Results  Labs (all labs ordered are listed, but only abnormal results are displayed) Labs Reviewed - No data to display  EKG   Radiology No results found.  Procedures Procedures (including critical care time)  Medications Ordered in UC Medications - No data to display  Initial Impression / Assessment and Plan / UC Course  I have reviewed the triage vital signs and the nursing notes.  Pertinent labs & imaging results that were available during my care of the patient were reviewed by me and considered in my medical decision making (see chart for details).       This patient is a 30 year old female presenting with continued shortness of breath and L AOM following diagnosis of covid 2 weeks ago.  BP significantly elevated at 203/157. On recheck, 190/150. Patient denies current chest pain but did have left-sided chest pain 12 hours ago. EKG today with t wave inversions throughout. No prior EKG for comparison. This patient has a history of hypertension, currently untreated. She is also super morbidly obese. Denies history cardiopulmonary disease.   I am sending this patient to  [facility] ED for further evaluation of chest pain and abnormal EKG. Discussed option of transport by EMS, which she declines. She is currently hemodynamically stable for transport in personal vehicle by family member.   This patient does appear to have L sided AOM, but I will defer treatment to ED provider.   Spent over 40 minutes obtaining H&P, performing physical, interpreting EKG, discussing results, treatment plan and plan for follow-up with patient. Patient agrees with plan.   This chart was dictated using voice recognition software, Dragon. Despite the best efforts of this provider to proofread and correct errors, errors may still occur which can change documentation meaning.   Final Clinical Impressions(s) / UC Diagnoses   Final diagnoses:  COVID-19  Hypertensive emergency  Non-recurrent acute suppurative otitis media of left ear without spontaneous rupture of tympanic membrane  Essential hypertension  Nonspecific abnormal electrocardiogram (ECG) (EKG)     Discharge Instructions     Head straight to [facility] ED for further cardiac evaluation. Make sure your mom drives the vehicle.  If you experience chest pain, dizziness, new headaches, pain down left arm, etc while on the way to the ED- stop immediately and call 911.     ED Prescriptions    None     PDMP not reviewed this encounter.   Rhys Martini, PA-C 10/01/20 2030

## 2020-10-01 NOTE — ED Notes (Signed)
ED Provider at bedside. 

## 2020-10-01 NOTE — ED Triage Notes (Addendum)
Pt reports being diagnosed with COVID Feb 3rd. Went to UC this evening c/o cough, wheezing and SOB. Also noted to be hypertensive and have abnormal EKG. Pt not in distress at present.

## 2020-10-01 NOTE — Discharge Instructions (Addendum)
Head straight to [facility] ED for further cardiac evaluation. Make sure your mom drives the vehicle.  If you experience chest pain, dizziness, new headaches, pain down left arm, etc while on the way to the ED- stop immediately and call 911.

## 2020-10-01 NOTE — ED Triage Notes (Signed)
Pt c/o wheezing and SOB. Pt states she is still coughing. She denies fever and sore throat. Pt states she was COVID positive on 09/17/20. Pt states she has been having trouble hearing.

## 2020-10-01 NOTE — ED Notes (Signed)
Unsuccessful IV and blood draw by this RN, order placed for IV team. Patient and mother made aware of same and updated on plan of care, deny further needs

## 2020-10-01 NOTE — ED Notes (Signed)
Patient is being discharged from the Urgent Care and sent to the Emergency Department via POV. Per L. Cheree Ditto, patient is in need of higher level of care due to SOB, HTN crisis. Patient is aware and verbalizes understanding of plan of care.  Vitals:   10/01/20 1925  BP: (!) 205/157  Pulse: (!) 103  Resp: 18  Temp: 97.9 F (36.6 C)  SpO2: 95%

## 2020-10-02 ENCOUNTER — Emergency Department (HOSPITAL_COMMUNITY): Payer: No Typology Code available for payment source

## 2020-10-02 LAB — D-DIMER, QUANTITATIVE: D-Dimer, Quant: 0.67 ug/mL-FEU — ABNORMAL HIGH (ref 0.00–0.50)

## 2020-10-02 LAB — TROPONIN I (HIGH SENSITIVITY): Troponin I (High Sensitivity): 6 ng/L (ref <18)

## 2020-10-02 MED ORDER — PREDNISONE 20 MG PO TABS: 40.0000 mg | ORAL_TABLET | Freq: Every day | ORAL | 0 refills | Status: AC

## 2020-10-02 MED ORDER — IOHEXOL 350 MG/ML SOLN
100.0000 mL | Freq: Once | INTRAVENOUS | Status: AC | PRN
  Administered 2020-10-02: 100 mL via INTRAVENOUS

## 2020-10-02 MED ORDER — AMLODIPINE BESYLATE 10 MG PO TABS: 10.0000 mg | ORAL_TABLET | Freq: Every day | ORAL | 1 refills | Status: DC

## 2020-10-02 MED ORDER — AMLODIPINE BESYLATE 5 MG PO TABS
10.0000 mg | ORAL_TABLET | Freq: Once | ORAL | Status: AC
  Administered 2020-10-02: 10 mg via ORAL
  Filled 2020-10-02: qty 2

## 2020-10-02 NOTE — ED Notes (Signed)
Patient in CT at this time.

## 2020-10-02 NOTE — Discharge Instructions (Addendum)
Please begin taking blood pressure medication once daily, please keep a log of your blood pressure medications, check it in the morning and then again in the afternoon or evening.  It is extremely important that you set up a follow-up appointment with primary care doctor for continued blood pressure management.  They may need to adjust the dose of your medication or add other medications to help control your blood pressure.  Blood pressure control is extremely important to help prevent heart disease, kidney disease and stroke.  To help with wheezing and shortness of breath please take steroids for the next 4 days, you can use 2 puffs of the inhaler you were given every 4-6 hours as needed.  Suspect wheezing is due to continued lung inflammation from Covid infection we did not see any evidence of pneumonia or blood clot in your lung.  The rest of your lab work looks good.

## 2021-02-07 ENCOUNTER — Other Ambulatory Visit: Payer: Self-pay

## 2021-02-07 ENCOUNTER — Encounter (HOSPITAL_COMMUNITY): Payer: Self-pay

## 2021-02-07 ENCOUNTER — Ambulatory Visit (HOSPITAL_COMMUNITY)
Admission: EM | Admit: 2021-02-07 | Discharge: 2021-02-07 | Disposition: A | Discharge status: Home / Self Care | Payer: No Typology Code available for payment source

## 2021-02-07 DIAGNOSIS — R059 Cough, unspecified: Secondary | ICD-10-CM

## 2021-02-07 DIAGNOSIS — J302 Other seasonal allergic rhinitis: Secondary | ICD-10-CM | POA: Diagnosis not present

## 2021-02-07 NOTE — ED Triage Notes (Signed)
Pt presents with non productive cough and congestion X 3 weeks. 

## 2021-02-07 NOTE — Discharge Instructions (Addendum)
Take an allergy medication, such as Claritin, Zyrtec, Allegra, or Xyzal daily.    Use Flonase nasal sprays 1-2 sprays in each nostril daily for congestion.    You can take Tylenol and/or Ibuprofen as needed for fever reduction and pain relief.    For cough: honey 1/2 to 1 teaspoon (you can dilute the honey in water or another fluid).  You can also use guaifenesin and dextromethorphan for cough. You can use a humidifier for chest congestion and cough.  If you don't have a humidifier, you can sit in the bathroom with the hot shower running.     For sore throat: try warm salt water gargles, cepacol lozenges, throat spray, warm tea or water with lemon/honey, popsicles or ice, or OTC cold relief medicine for throat discomfort.    For congestion: take an oral decongestant, such as pseudoephedrine. You can also try nasal saline and suctioning or a nettie pot.     It is important to stay hydrated: drink plenty of fluids (water, gatorade/powerade/pedialyte, juices, or teas) to keep your throat moisturized and help further relieve irritation/discomfort.   Return or go to the Emergency Department if symptoms worsen or do not improve in the next few days.

## 2021-02-07 NOTE — ED Provider Notes (Signed)
[facility]    CSN: [id number] Arrival date & time: 02/07/21  1005      History   Chief Complaint Chief Complaint  Patient presents with   Cough    HPI Stacey Reynolds is a 30 y.o. female.   Patient here for evaluation of cough and congestion that has been ongoing for the past several weeks.  Reports occasionally coughing up clear phlegm but otherwise cough is nonproductive.  Reports history of COVID in February.  Denies any known sick contacts.  Reports taking Mucinex with some symptom relief.  Denies any trauma, injury, or other precipitating event.  Denies any fevers, chest pain, shortness of breath, N/V/D, numbness, tingling, weakness, abdominal pain, or headaches.     The history is provided by the patient.  Cough Associated symptoms: no fever, no shortness of breath and no sore throat    Past Medical History:  Diagnosis Date   Hypertension    Obesity     Patient Active Problem List   Diagnosis Date Noted   Goiter 03/02/2018   Essential hypertension 03/02/2018   DUB (dysfunctional uterine bleeding) 09/23/2015   Anemia due to blood loss, chronic 08/29/2015    Past Surgical History:  Procedure Laterality Date   TONSILLECTOMY      OB History   No obstetric history on file.      Home Medications    Prior to Admission medications   Medication Sig Start Date End Date Taking? Authorizing Provider  amLODipine (NORVASC) 10 MG tablet Take 1 tablet (10 mg total) by mouth daily. 10/02/20   Dartha Lodge, PA-C  benzonatate (TESSALON) 100 MG capsule Take 1 capsule (100 mg total) by mouth every 8 (eight) hours. Patient not taking: Reported on 10/02/2020 09/17/20   Rhys Martini, PA-C  ibuprofen (ADVIL) 800 MG tablet Take 1 tablet (800 mg total) by mouth 3 (three) times daily. Patient taking differently: Take 800 mg by mouth every 8 (eight) hours as needed for headache or moderate pain. 09/17/20   Rhys Martini, PA-C  lidocaine (XYLOCAINE) 2 % solution  Use as directed 15 mLs in the mouth or throat as needed for mouth pain. 09/17/20   Rhys Martini, PA-C  promethazine-dextromethorphan (PROMETHAZINE-DM) 6.25-15 MG/5ML syrup Take 5 mLs by mouth 4 (four) times daily as needed for cough. Patient not taking: Reported on 10/02/2020 09/17/20   Samuella Cota    Family History Family History  Problem Relation Age of Onset   Healthy Mother    Cancer Father     Social History Social History   Tobacco Use   Smoking status: Never   Smokeless tobacco: Never  Substance Use Topics   Alcohol use: Yes    Comment: occasionally   Drug use: No     Allergies   Lisinopril   Review of Systems Review of Systems  Constitutional:  Negative for fatigue and fever.  HENT:  Positive for congestion. Negative for sore throat.   Respiratory:  Positive for cough. Negative for chest tightness and shortness of breath.   All other systems reviewed and are negative.   Physical Exam Triage Vital Signs ED Triage Vitals  Enc Vitals Group     BP 02/07/21 1030 (!) 159/117     Pulse Rate 02/07/21 1030 (!) 102     Resp 02/07/21 1030 18     Temp 02/07/21 1030 98.2 F (36.8 C)     Temp Source 02/07/21 1030 Oral     SpO2  02/07/21 1030 100 %     Weight --      Height --      Head Circumference --      Peak Flow --      Pain Score 02/07/21 1032 0     Pain Loc --      Pain Edu? --      Excl. in GC? --    No data found.  Updated Vital Signs BP (!) 159/117 (BP Location: Right Arm)   Pulse (!) 102   Temp 98.2 F (36.8 C) (Oral)   Resp 18   LMP 02/04/2021   SpO2 100%   Visual Acuity Right Eye Distance:   Left Eye Distance:   Bilateral Distance:    Right Eye Near:   Left Eye Near:    Bilateral Near:     Physical Exam Vitals and nursing note reviewed.  Constitutional:      General: She is not in acute distress.    Appearance: Normal appearance. She is not ill-appearing, toxic-appearing or diaphoretic.  HENT:     Head: Normocephalic and  atraumatic.     Nose: No congestion or rhinorrhea.     Right Turbinates: Swollen and pale.     Left Turbinates: Swollen and pale.     Mouth/Throat:     Mouth: Mucous membranes are moist.     Pharynx: Oropharynx is clear. No oropharyngeal exudate or posterior oropharyngeal erythema.  Eyes:     Conjunctiva/sclera: Conjunctivae normal.  Cardiovascular:     Rate and Rhythm: Normal rate.     Pulses: Normal pulses.     Heart sounds: Normal heart sounds.  Pulmonary:     Effort: Pulmonary effort is normal.     Breath sounds: Normal breath sounds.  Abdominal:     General: Abdomen is flat.  Musculoskeletal:        General: Normal range of motion.     Cervical back: Normal range of motion.  Skin:    General: Skin is warm and dry.  Neurological:     General: No focal deficit present.     Mental Status: She is alert and oriented to person, place, and time.  Psychiatric:        Mood and Affect: Mood normal.     UC Treatments / Results  Labs (all labs ordered are listed, but only abnormal results are displayed) Labs Reviewed - No data to display  EKG   Radiology No results found.  Procedures Procedures (including critical care time)  Medications Ordered in UC Medications - No data to display  Initial Impression / Assessment and Plan / UC Course  I have reviewed the triage vital signs and the nursing notes.  Pertinent labs & imaging results that were available during my care of the patient were reviewed by me and considered in my medical decision making (see chart for details).    Assessment negative for red flag concerns.  Seasonal allergies versus upper respiratory virus.   May continue to take OTC cough medicine and Mucinex.  Recommend adding an allergy medication such as Claritin or Zyrtec as well as Flonase nasal spray daily.  May take Tylenol and/or ibuprofen as needed for pain and fevers.  COVID test offered but declined by patient.  Encourage fluids and rest.  Discussed  conservative symptom management as described in discharge instructions.  Follow-up with primary care as needed Final Clinical Impressions(s) / UC Diagnoses   Final diagnoses:  Cough  Seasonal allergies  Discharge Instructions      Take an allergy medication, such as Claritin, Zyrtec, Allegra, or Xyzal daily.    Use Flonase nasal sprays 1-2 sprays in each nostril daily for congestion.    You can take Tylenol and/or Ibuprofen as needed for fever reduction and pain relief.    For cough: honey 1/2 to 1 teaspoon (you can dilute the honey in water or another fluid).  You can also use guaifenesin and dextromethorphan for cough. You can use a humidifier for chest congestion and cough.  If you don't have a humidifier, you can sit in the bathroom with the hot shower running.     For sore throat: try warm salt water gargles, cepacol lozenges, throat spray, warm tea or water with lemon/honey, popsicles or ice, or OTC cold relief medicine for throat discomfort.    For congestion: take an oral decongestant, such as pseudoephedrine. You can also try nasal saline and suctioning or a nettie pot.     It is important to stay hydrated: drink plenty of fluids (water, gatorade/powerade/pedialyte, juices, or teas) to keep your throat moisturized and help further relieve irritation/discomfort.   Return or go to the Emergency Department if symptoms worsen or do not improve in the next few days.      ED Prescriptions   None    PDMP not reviewed this encounter.   Ivette Loyal, NP 02/07/21 1055

## 2021-04-11 ENCOUNTER — Emergency Department (HOSPITAL_BASED_OUTPATIENT_CLINIC_OR_DEPARTMENT_OTHER): Payer: No Typology Code available for payment source | Admitting: Radiology

## 2021-04-11 ENCOUNTER — Encounter (HOSPITAL_BASED_OUTPATIENT_CLINIC_OR_DEPARTMENT_OTHER): Payer: Self-pay | Admitting: Emergency Medicine

## 2021-04-11 ENCOUNTER — Other Ambulatory Visit: Payer: Self-pay

## 2021-04-11 ENCOUNTER — Ambulatory Visit (HOSPITAL_COMMUNITY)
Admission: EM | Admit: 2021-04-11 | Discharge: 2021-04-11 | Disposition: A | Discharge status: Home / Self Care | Payer: No Typology Code available for payment source

## 2021-04-11 ENCOUNTER — Emergency Department (HOSPITAL_BASED_OUTPATIENT_CLINIC_OR_DEPARTMENT_OTHER)
Admission: EM | Admit: 2021-04-11 | Discharge: 2021-04-11 | Disposition: A | Discharge status: Home / Self Care | Payer: No Typology Code available for payment source | Attending: Emergency Medicine | Admitting: Emergency Medicine

## 2021-04-11 DIAGNOSIS — Y9239 Other specified sports and athletic area as the place of occurrence of the external cause: Secondary | ICD-10-CM | POA: Insufficient documentation

## 2021-04-11 DIAGNOSIS — I10 Essential (primary) hypertension: Secondary | ICD-10-CM | POA: Insufficient documentation

## 2021-04-11 DIAGNOSIS — S99912A Unspecified injury of left ankle, initial encounter: Secondary | ICD-10-CM | POA: Diagnosis not present

## 2021-04-11 DIAGNOSIS — W01198A Fall on same level from slipping, tripping and stumbling with subsequent striking against other object, initial encounter: Secondary | ICD-10-CM | POA: Insufficient documentation

## 2021-04-11 DIAGNOSIS — Z79899 Other long term (current) drug therapy: Secondary | ICD-10-CM | POA: Diagnosis not present

## 2021-04-11 NOTE — ED Notes (Signed)
Called to do registration, no answered.
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
## 2021-04-11 NOTE — ED Triage Notes (Signed)
Pt got left ankle stuck in some equipment at the gym 1 week ago and fell forward off of machine while ankle was pinned down.  Pt reports treating at home with ice with minimal relief.  Pt states left ankle pain "burns"

## 2021-04-11 NOTE — ED Triage Notes (Signed)
Pt called from front lobby with no answer 

## 2021-09-18 ENCOUNTER — Emergency Department (HOSPITAL_COMMUNITY): Payer: No Typology Code available for payment source

## 2021-09-18 ENCOUNTER — Emergency Department (HOSPITAL_COMMUNITY)
Admission: EM | Admit: 2021-09-18 | Discharge: 2021-09-19 | Disposition: A | Discharge status: Home / Self Care | Payer: No Typology Code available for payment source | Attending: Emergency Medicine | Admitting: Emergency Medicine

## 2021-09-18 ENCOUNTER — Other Ambulatory Visit: Payer: Self-pay

## 2021-09-18 ENCOUNTER — Encounter (HOSPITAL_COMMUNITY): Payer: Self-pay | Admitting: Emergency Medicine

## 2021-09-18 DIAGNOSIS — N9489 Other specified conditions associated with female genital organs and menstrual cycle: Secondary | ICD-10-CM | POA: Diagnosis not present

## 2021-09-18 DIAGNOSIS — S299XXA Unspecified injury of thorax, initial encounter: Secondary | ICD-10-CM | POA: Insufficient documentation

## 2021-09-18 DIAGNOSIS — Y92410 Unspecified street and highway as the place of occurrence of the external cause: Secondary | ICD-10-CM | POA: Insufficient documentation

## 2021-09-18 DIAGNOSIS — D649 Anemia, unspecified: Secondary | ICD-10-CM

## 2021-09-18 DIAGNOSIS — S3991XA Unspecified injury of abdomen, initial encounter: Secondary | ICD-10-CM | POA: Insufficient documentation

## 2021-09-18 DIAGNOSIS — Z20822 Contact with and (suspected) exposure to covid-19: Secondary | ICD-10-CM | POA: Diagnosis not present

## 2021-09-18 DIAGNOSIS — T1490XA Injury, unspecified, initial encounter: Secondary | ICD-10-CM

## 2021-09-18 DIAGNOSIS — I10 Essential (primary) hypertension: Secondary | ICD-10-CM

## 2021-09-18 DIAGNOSIS — S27321A Contusion of lung, unilateral, initial encounter: Secondary | ICD-10-CM

## 2021-09-18 LAB — PROTIME-INR
INR: 1.1 (ref 0.8–1.2)
Prothrombin Time: 13.7 seconds (ref 11.4–15.2)

## 2021-09-18 LAB — I-STAT CHEM 8, ED
BUN: 10 mg/dL (ref 6–20)
Calcium, Ion: 1.17 mmol/L (ref 1.15–1.40)
Chloride: 105 mmol/L (ref 98–111)
Creatinine, Ser: 0.8 mg/dL (ref 0.44–1.00)
Glucose, Bld: 120 mg/dL — ABNORMAL HIGH (ref 70–99)
HCT: 29 % — ABNORMAL LOW (ref 36.0–46.0)
Hemoglobin: 9.9 g/dL — ABNORMAL LOW (ref 12.0–15.0)
Potassium: 3.3 mmol/L — ABNORMAL LOW (ref 3.5–5.1)
Sodium: 140 mmol/L (ref 135–145)
TCO2: 23 mmol/L (ref 22–32)

## 2021-09-18 LAB — COMPREHENSIVE METABOLIC PANEL
ALT: 49 U/L — ABNORMAL HIGH (ref 0–44)
AST: 76 U/L — ABNORMAL HIGH (ref 15–41)
Albumin: 3.8 g/dL (ref 3.5–5.0)
Alkaline Phosphatase: 67 U/L (ref 38–126)
Anion gap: 10 (ref 5–15)
BUN: 10 mg/dL (ref 6–20)
CO2: 21 mmol/L — ABNORMAL LOW (ref 22–32)
Calcium: 9 mg/dL (ref 8.9–10.3)
Chloride: 105 mmol/L (ref 98–111)
Creatinine, Ser: 0.91 mg/dL (ref 0.44–1.00)
GFR, Estimated: >60 mL/min (ref >60)
Glucose, Bld: 120 mg/dL — ABNORMAL HIGH (ref 70–99)
Potassium: 3.5 mmol/L (ref 3.5–5.1)
Sodium: 136 mmol/L (ref 135–145)
Total Bilirubin: 0.6 mg/dL (ref 0.3–1.2)
Total Protein: 7.5 g/dL (ref 6.5–8.1)

## 2021-09-18 LAB — CBC
HCT: 26.5 % — ABNORMAL LOW (ref 36.0–46.0)
Hemoglobin: 7.6 g/dL — ABNORMAL LOW (ref 12.0–15.0)
MCH: 18.8 pg — ABNORMAL LOW (ref 26.0–34.0)
MCHC: 28.7 g/dL — ABNORMAL LOW (ref 30.0–36.0)
MCV: 65.4 fL — ABNORMAL LOW (ref 80.0–100.0)
Platelets: 508 10*3/uL — ABNORMAL HIGH (ref 150–400)
RBC: 4.05 MIL/uL (ref 3.87–5.11)
RDW: 20.9 % — ABNORMAL HIGH (ref 11.5–15.5)
WBC: 16.9 10*3/uL — ABNORMAL HIGH (ref 4.0–10.5)
nRBC: 0 % (ref 0.0–0.2)

## 2021-09-18 LAB — RESP PANEL BY RT-PCR (FLU A&B, COVID) ARPGX2
Influenza A by PCR: NEGATIVE
Influenza B by PCR: NEGATIVE
SARS Coronavirus 2 by RT PCR: NEGATIVE

## 2021-09-18 LAB — ETHANOL: Alcohol, Ethyl (B): <10 mg/dL (ref <10)

## 2021-09-18 LAB — I-STAT BETA HCG BLOOD, ED (MC, WL, AP ONLY): I-stat hCG, quantitative: <5 m[IU]/mL (ref <5)

## 2021-09-18 LAB — SAMPLE TO BLOOD BANK

## 2021-09-18 LAB — LACTIC ACID, PLASMA: Lactic Acid, Venous: 2.1 mmol/L (ref 0.5–1.9)
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
## 2021-09-18 NOTE — ED Notes (Signed)
Pt placed on a purewick 
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
## 2021-09-19 ENCOUNTER — Emergency Department (HOSPITAL_COMMUNITY): Payer: No Typology Code available for payment source

## 2021-09-19 LAB — URINALYSIS, MICROSCOPIC (REFLEX): Bacteria, UA: NONE SEEN

## 2021-09-19 LAB — CBC WITH DIFFERENTIAL/PLATELET
Abs Immature Granulocytes: 0.07 10*3/uL (ref 0.00–0.07)
Basophils Absolute: 0 10*3/uL (ref 0.0–0.1)
Basophils Relative: 0 %
Eosinophils Absolute: 0 10*3/uL (ref 0.0–0.5)
Eosinophils Relative: 0 %
HCT: 26.2 % — ABNORMAL LOW (ref 36.0–46.0)
Hemoglobin: 7.3 g/dL — ABNORMAL LOW (ref 12.0–15.0)
Immature Granulocytes: 1 %
Lymphocytes Relative: 11 %
Lymphs Abs: 1.7 10*3/uL (ref 0.7–4.0)
MCH: 18.3 pg — ABNORMAL LOW (ref 26.0–34.0)
MCHC: 27.9 g/dL — ABNORMAL LOW (ref 30.0–36.0)
MCV: 65.5 fL — ABNORMAL LOW (ref 80.0–100.0)
Monocytes Absolute: 0.9 10*3/uL (ref 0.1–1.0)
Monocytes Relative: 6 %
Neutro Abs: 12.5 10*3/uL — ABNORMAL HIGH (ref 1.7–7.7)
Neutrophils Relative %: 82 %
Platelets: 521 10*3/uL — ABNORMAL HIGH (ref 150–400)
RBC: 4 MIL/uL (ref 3.87–5.11)
RDW: 20.6 % — ABNORMAL HIGH (ref 11.5–15.5)
WBC: 15.2 10*3/uL — ABNORMAL HIGH (ref 4.0–10.5)
nRBC: 0 % (ref 0.0–0.2)

## 2021-09-19 LAB — URINALYSIS, ROUTINE W REFLEX MICROSCOPIC
Bilirubin Urine: NEGATIVE
Glucose, UA: NEGATIVE mg/dL
Ketones, ur: NEGATIVE mg/dL
Leukocytes,Ua: NEGATIVE
Nitrite: NEGATIVE
Protein, ur: NEGATIVE mg/dL
Specific Gravity, Urine: 1.01 (ref 1.005–1.030)
pH: 7 (ref 5.0–8.0)

## 2021-09-19 MED ORDER — DOCUSATE SODIUM 100 MG PO CAPS: 100.0000 mg | ORAL_CAPSULE | Freq: Every day | ORAL | 0 refills | Status: AC

## 2021-09-19 MED ORDER — FENTANYL CITRATE PF 50 MCG/ML IJ SOSY
100.0000 ug | PREFILLED_SYRINGE | Freq: Once | INTRAMUSCULAR | Status: AC
  Administered 2021-09-19: 100 ug via INTRAVENOUS
  Filled 2021-09-19: qty 2

## 2021-09-19 MED ORDER — FERROUS SULFATE 325 (65 FE) MG PO TABS: 325.0000 mg | ORAL_TABLET | Freq: Three times a day (TID) | ORAL | 0 refills | Status: DC

## 2021-09-19 MED ORDER — IOHEXOL 300 MG/ML  SOLN
115.0000 mL | Freq: Once | INTRAMUSCULAR | Status: AC | PRN
  Administered 2021-09-19: 115 mL via INTRAVENOUS

## 2021-09-19 MED ORDER — HYDROCODONE-ACETAMINOPHEN 5-325 MG PO TABS: 1.0000 | ORAL_TABLET | Freq: Four times a day (QID) | ORAL | 0 refills | Status: DC | PRN

## 2021-09-19 MED ORDER — AMLODIPINE BESYLATE 5 MG PO TABS
10.0000 mg | ORAL_TABLET | Freq: Once | ORAL | Status: AC
  Administered 2021-09-19: 10 mg via ORAL
  Filled 2021-09-19: qty 2

## 2021-09-19 MED ORDER — OXYCODONE-ACETAMINOPHEN 5-325 MG PO TABS
1.0000 | ORAL_TABLET | Freq: Once | ORAL | Status: AC
  Administered 2021-09-19: 1 via ORAL
  Filled 2021-09-19: qty 1

## 2021-09-19 MED ORDER — SODIUM CHLORIDE 0.9 % IV BOLUS
1000.0000 mL | Freq: Once | INTRAVENOUS | Status: AC
  Administered 2021-09-19: 1000 mL via INTRAVENOUS

## 2021-09-19 MED ORDER — SODIUM CHLORIDE 0.9 % IV BOLUS
500.0000 mL | Freq: Once | INTRAVENOUS | Status: AC
  Administered 2021-09-19: 500 mL via INTRAVENOUS
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
## 2021-09-19 NOTE — ED Notes (Signed)
IV team at pt bedside 

## 2021-09-19 NOTE — Discharge Instructions (Addendum)
Use the incentive spirometer 10 times an hour every hour while you are awake.  Additionally you were noted to be very anemic today on your lab evaluation.  I suspect that this is likely chronic in nature.  I have started you on iron pills to help improve your anemia.   These medications may cause constipation so I have also started you on colace to prevent this.   You will need to have your labs rechecked by your doctor within 1 week.  You will also need to follow-up in regards to your pulmonary contusion.  Please return the emergency department for any new or worsening symptoms including chest pain, shortness of breath, coughing up blood, lightheadedness or passing out or any other new or worsening symptoms.

## 2021-09-19 NOTE — ED Notes (Signed)
Pt ambulated with pulse ox alongside ED staff. Oxygen saturation remained 100% and pulse remained within normal limits while ambulating. Pt denies feeling any weakness, dizziness, or SOB during ambulation.

## 2021-09-19 NOTE — ED Notes (Signed)
E-signature pad unavailable at time of pt discharge. This RN discussed discharge materials with pt and answered all pt questions. Pt stated understanding of discharge material. ? ?

## 2021-10-23 IMAGING — CT CT ANGIO CHEST
2 of 7 series · 18 of 46 positions shown · IV contrast (omnipaque)
Comparison: Chest x-ray 10/01/2020.

CLINICAL DATA: COVID [DATE][REDACTED]. Went to [facility] this evening c/o cough,
wheezing and SOB. Positive D-dimer. Pulmonary embolus suspected.

EXAM:
CT ANGIOGRAPHY CHEST WITH CONTRAST
TECHNIQUE: Multidetector CT imaging of the chest was performed using the
standard protocol during bolus administration of intravenous
contrast. Multiplanar CT image reconstructions and MIPs were
obtained to evaluate the vascular anatomy.
CONTRAST:  80mL OMNIPAQUE IOHEXOL 350 MG/ML SOLN

[Series 9: thins · axial · 0.98mm/px · z∈[+1195,+1474]mm · 15 of 448 slices shown]
[im 25/448  lung]
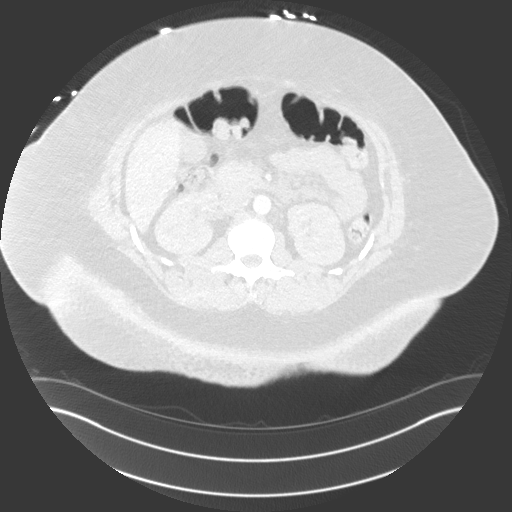
[im 50/448  soft-tissue]
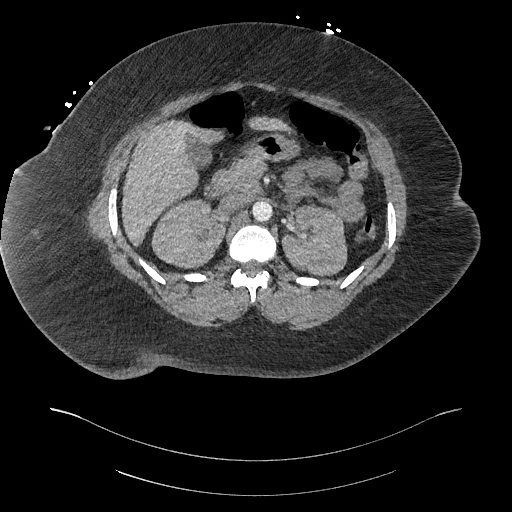
[im 75/448  lung]
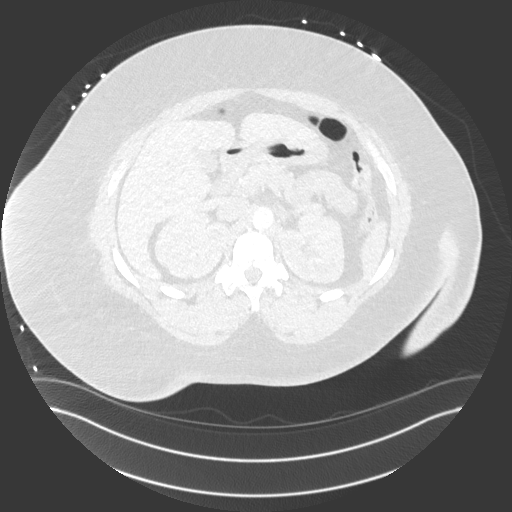
[im 100/448  soft-tissue]
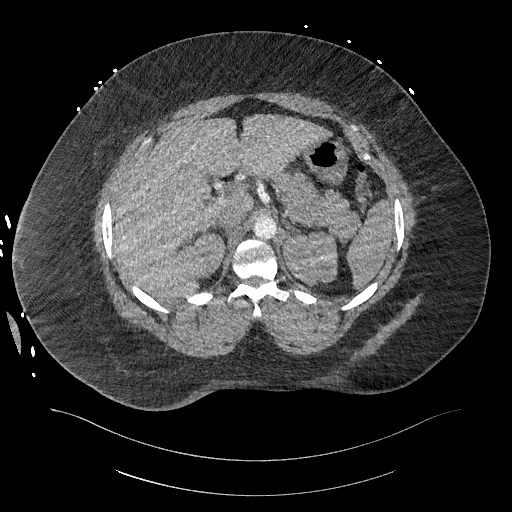
[im 150/448  lung]
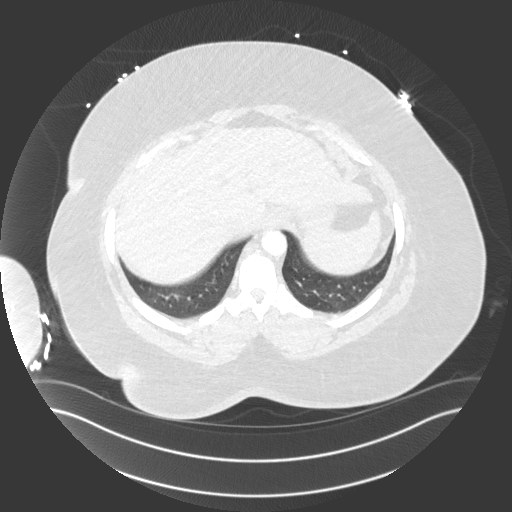
[im 174/448  soft-tissue]
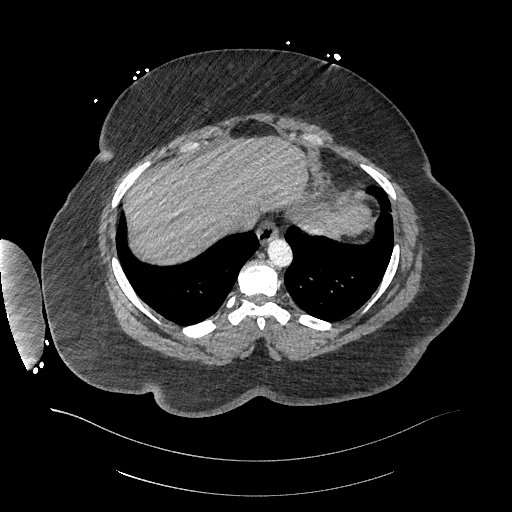
[im 199/448  lung]
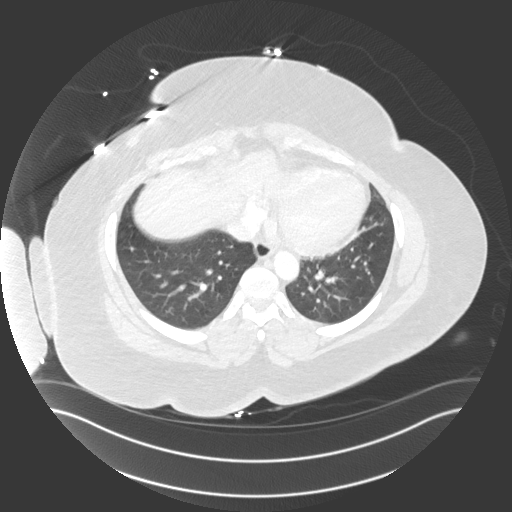
[im 224/448  soft-tissue]
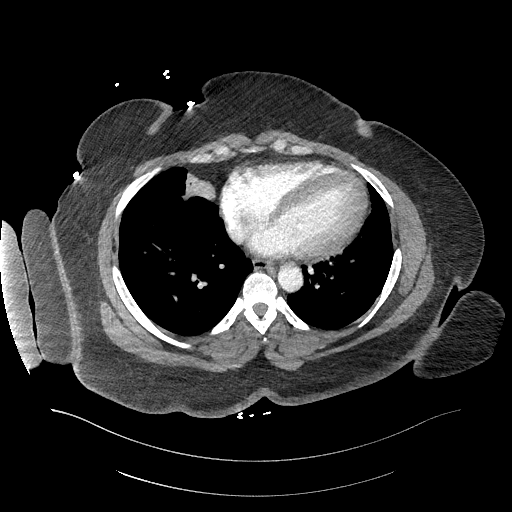
[im 249/448  lung]
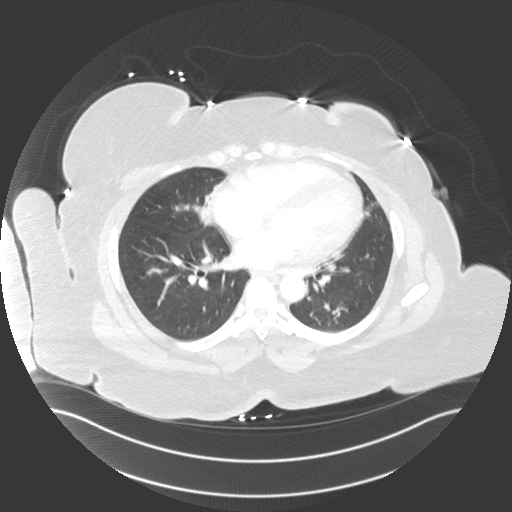
[im 274/448  soft-tissue]
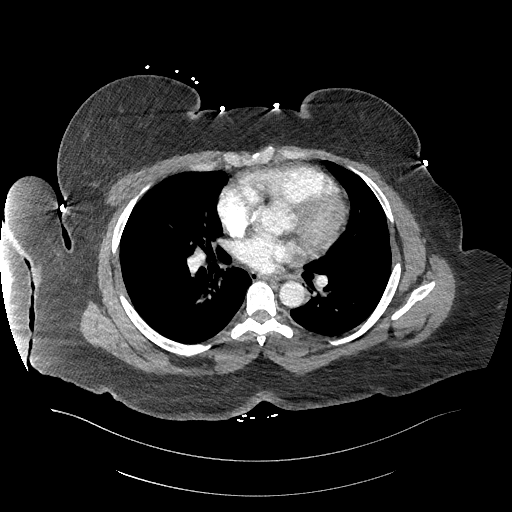
[im 299/448  lung]
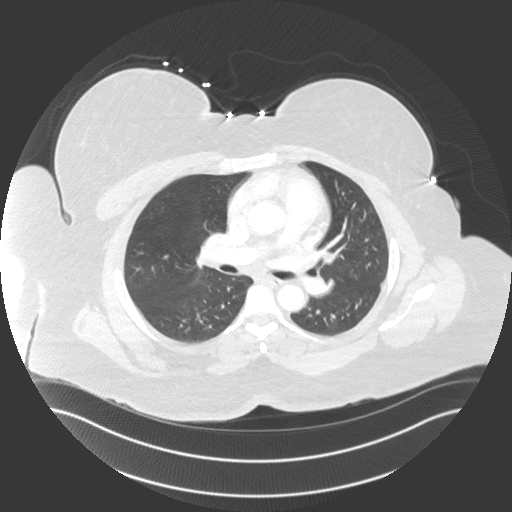
[im 348/448  soft-tissue]
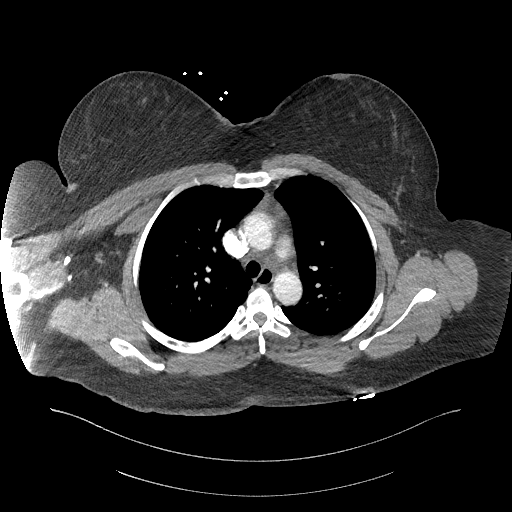
[im 373/448  lung]
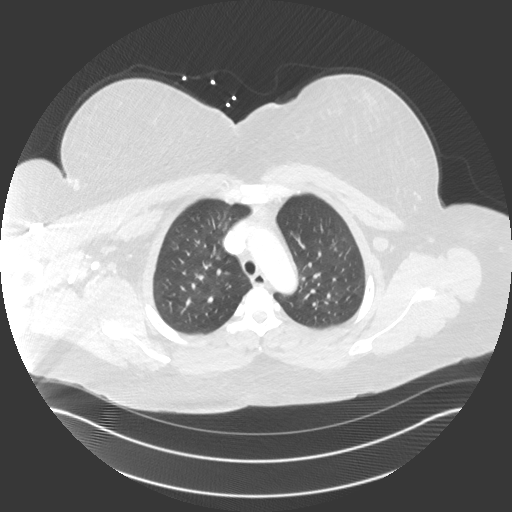
[im 398/448  soft-tissue]
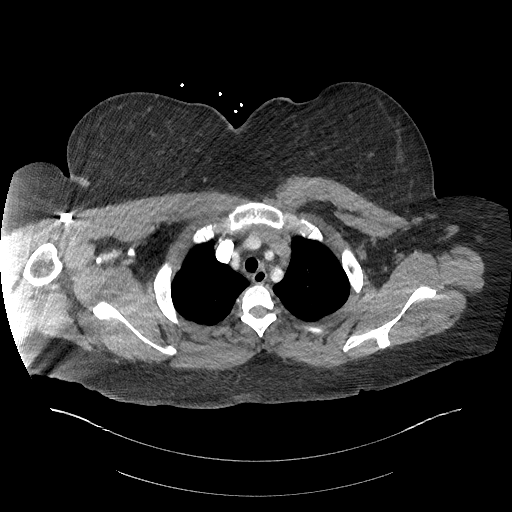
[im 423/448  lung]
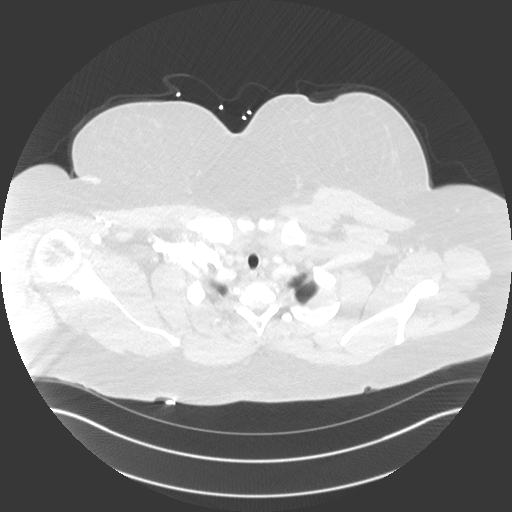

[Series 10: cor · coronal · 0.61mm/px · 3 of 199 slices shown]
[im 50/199  soft-tissue]
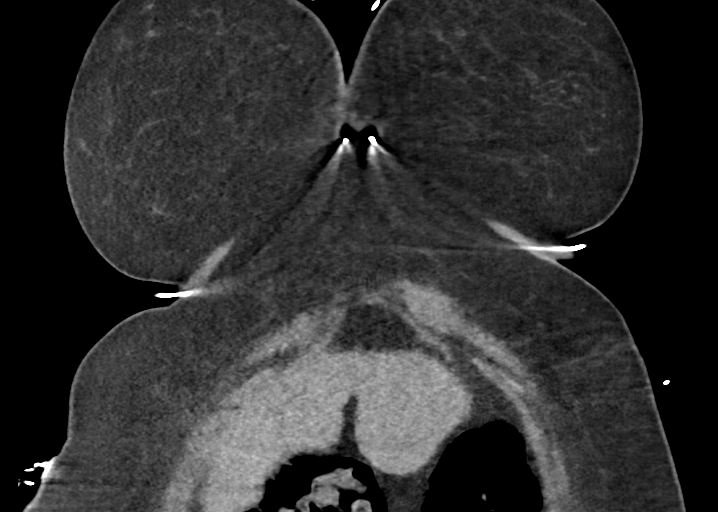
[im 100/199  soft-tissue]
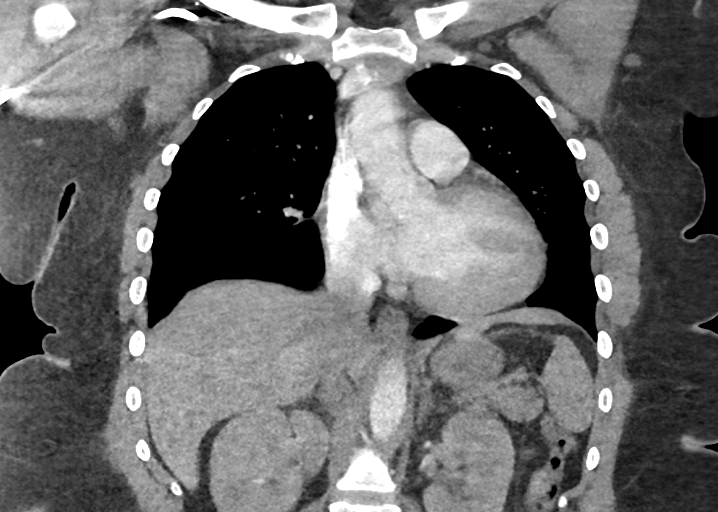
[im 149/199  soft-tissue]
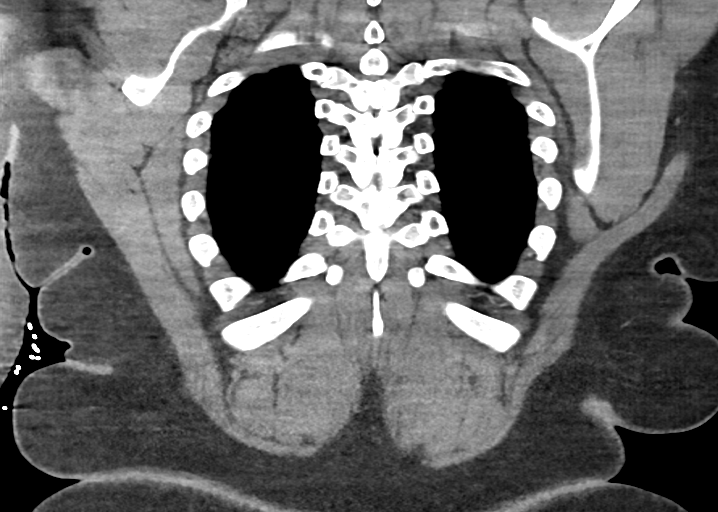

[18 of 46 positions shown; findings below may reference images not displayed]

FINDINGS: Cardiovascular: Satisfactory opacification of the pulmonary arteries
to the segmental level. No evidence of pulmonary embolism. Normal
heart size. No pericardial effusion.

Mediastinum/Nodes: No enlarged mediastinal, hilar, or axillary lymph
nodes. Thyroid gland, trachea, and esophagus demonstrate no
significant findings.

Lungs/Pleura: No focal consolidation. No pulmonary nodule. No
pulmonary mass. No pleural effusion. No pneumothorax.

Upper Abdomen: No acute abnormality.

Musculoskeletal: No chest wall abnormality. No acute or significant
osseous findings.

Review of the MIP images confirms the above findings.
IMPRESSION: 1. No pulmonary embolus.
2. No acute intrathoracic abnormality.

## 2022-01-05 ENCOUNTER — Emergency Department (HOSPITAL_BASED_OUTPATIENT_CLINIC_OR_DEPARTMENT_OTHER): Payer: No Typology Code available for payment source | Admitting: Radiology

## 2022-01-05 ENCOUNTER — Other Ambulatory Visit: Payer: Self-pay

## 2022-01-05 DIAGNOSIS — R0789 Other chest pain: Secondary | ICD-10-CM | POA: Diagnosis not present

## 2022-01-05 DIAGNOSIS — D509 Iron deficiency anemia, unspecified: Secondary | ICD-10-CM | POA: Diagnosis not present

## 2022-01-05 DIAGNOSIS — M79641 Pain in right hand: Secondary | ICD-10-CM | POA: Insufficient documentation

## 2022-01-05 DIAGNOSIS — I10 Essential (primary) hypertension: Secondary | ICD-10-CM | POA: Insufficient documentation

## 2022-01-05 NOTE — ED Notes (Signed)
Attempted to get blood work x 2

## 2022-01-05 NOTE — ED Triage Notes (Signed)
Pt arrived POV c/o right hand pain x 5 days occurred while sweeping, full range of motion noted. Pt endorses intermittent chest pain since the right hand pain and would like to get it checked out to be sure, worse with movement. Denies SOB, no cough.

## 2022-01-06 ENCOUNTER — Emergency Department (HOSPITAL_BASED_OUTPATIENT_CLINIC_OR_DEPARTMENT_OTHER)
Admission: EM | Admit: 2022-01-06 | Discharge: 2022-01-06 | Disposition: A | Discharge status: Home / Self Care | Payer: No Typology Code available for payment source | Attending: Emergency Medicine | Admitting: Emergency Medicine

## 2022-01-06 DIAGNOSIS — M79641 Pain in right hand: Secondary | ICD-10-CM

## 2022-01-06 DIAGNOSIS — R0789 Other chest pain: Secondary | ICD-10-CM

## 2022-01-06 LAB — TROPONIN I (HIGH SENSITIVITY): Troponin I (High Sensitivity): 3 ng/L (ref <18)

## 2022-01-06 LAB — URINALYSIS, ROUTINE W REFLEX MICROSCOPIC
Bilirubin Urine: NEGATIVE
Glucose, UA: NEGATIVE mg/dL
Ketones, ur: NEGATIVE mg/dL
Leukocytes,Ua: NEGATIVE
Nitrite: NEGATIVE
Protein, ur: NEGATIVE mg/dL
Specific Gravity, Urine: 1.014 (ref 1.005–1.030)
pH: 6.5 (ref 5.0–8.0)

## 2022-01-06 LAB — BASIC METABOLIC PANEL
Anion gap: 9 (ref 5–15)
BUN: 11 mg/dL (ref 6–20)
CO2: 25 mmol/L (ref 22–32)
Calcium: 9.4 mg/dL (ref 8.9–10.3)
Chloride: 104 mmol/L (ref 98–111)
Creatinine, Ser: 0.83 mg/dL (ref 0.44–1.00)
GFR, Estimated: >60 mL/min (ref >60)
Glucose, Bld: 85 mg/dL (ref 70–99)
Potassium: 3.4 mmol/L — ABNORMAL LOW (ref 3.5–5.1)
Sodium: 138 mmol/L (ref 135–145)

## 2022-01-06 LAB — CBC
HCT: 32.9 % — ABNORMAL LOW (ref 36.0–46.0)
Hemoglobin: 10.5 g/dL — ABNORMAL LOW (ref 12.0–15.0)
MCH: 25.5 pg — ABNORMAL LOW (ref 26.0–34.0)
MCHC: 31.9 g/dL (ref 30.0–36.0)
MCV: 80 fL (ref 80.0–100.0)
Platelets: 438 10*3/uL — ABNORMAL HIGH (ref 150–400)
RBC: 4.11 MIL/uL (ref 3.87–5.11)
RDW: 15.9 % — ABNORMAL HIGH (ref 11.5–15.5)
WBC: 8.7 10*3/uL (ref 4.0–10.5)
nRBC: 0 % (ref 0.0–0.2)

## 2022-01-06 LAB — LIPASE, BLOOD: Lipase: 19 U/L (ref 11–51)

## 2022-01-06 LAB — PREGNANCY, URINE: Preg Test, Ur: NEGATIVE

## 2022-01-06 MED ORDER — NAPROXEN 375 MG PO TABS: ORAL_TABLET | ORAL | 0 refills | Status: DC

## 2022-01-06 MED ORDER — NAPROXEN 250 MG PO TABS
500.0000 mg | ORAL_TABLET | Freq: Once | ORAL | Status: AC
Start: 2022-01-06 — End: 2022-01-06
  Administered 2022-01-06: 500 mg via ORAL
  Filled 2022-01-06: qty 2

## 2022-01-06 NOTE — ED Provider Notes (Signed)
[facility] Provider Note: Lowella Dell, MD, FACEP  CSN: [id number] MRN: [id number] ARRIVAL: 01/05/22 at 1759 ROOM: DB012/DB012   CHIEF COMPLAINT  Hand Pain and Chest Pain   HISTORY OF PRESENT ILLNESS  01/06/22 2:24 AM Stacey Reynolds is a 31 y.o. female with right hand pain for the past 6 days which developed after sweeping.  She is able to move her hand but it is painful.  She has also been having some upper chest discomfort, vaguely described, associated with a fluttering sensation.  This began soon after her hand began hurting.  Nothing makes her chest discomfort better or worse and she has no associated cough or shortness of breath.  She rates her pain as a 5 out of 10.   Past Medical History:  Diagnosis Date   Hypertension    Obesity     Past Surgical History:  Procedure Laterality Date   TONSILLECTOMY      Family History  Problem Relation Age of Onset   Healthy Mother    Cancer Father     Social History   Tobacco Use   Smoking status: Never   Smokeless tobacco: Never  Substance Use Topics   Alcohol use: Yes    Comment: occasionally   Drug use: No    Prior to Admission medications   Medication Sig Start Date End Date Taking? Authorizing Provider  naproxen (NAPROSYN) 375 MG tablet Take 1 tablet twice daily as needed for pain. 01/06/22  Yes Shadeed Colberg, MD  ferrous sulfate 325 (65 FE) MG tablet Take 1 tablet (325 mg total) by mouth 3 (three) times daily with meals. 09/19/21 10/19/21  Couture, Cortni S, PA-C  HYDROcodone-acetaminophen (NORCO/VICODIN) 5-325 MG tablet Take 1 tablet by mouth every 6 (six) hours as needed. 09/19/21   Couture, Cortni S, PA-C    Allergies Lisinopril   REVIEW OF SYSTEMS  Negative except as noted here or in the History of Present Illness.   PHYSICAL EXAMINATION  Initial Vital Signs Blood pressure (!) 147/101, pulse 62, temperature 98 F (36.7 C), resp. rate 18, height 5\' 7"  (1.702 m), weight (!) 175.1 kg, last  menstrual period 01/04/2022, SpO2 100 %.  Examination General: Well-developed, well-nourished female in no acute distress; appearance consistent with age of record HENT: normocephalic; atraumatic Eyes: Normal appearance Neck: supple Heart: regular rate and rhythm Lungs: clear to auscultation bilaterally Chest: Nontender Abdomen: soft; nondistended; nontender; bowel sounds present Extremities: No deformity; full range of motion; pulses normal Neurologic: Awake, alert and oriented; motor function intact in all extremities and symmetric; no facial droop Skin: Warm and dry Psychiatric: Normal mood and affect   RESULTS  Summary of this visit's results, reviewed and interpreted by myself:   EKG Interpretation  Date/Time:  Wednesday Jan 05 2022 18:08:24 EDT Ventricular Rate:  85 PR Interval:  184 QRS Duration: 80 QT Interval:  352 QTC Calculation: 418 R Axis:   91 Text Interpretation: Normal sinus rhythm Rightward axis T wave abnormality, consider inferior ischemia Abnormal ECG When compared with ECG of 18-Sep-2021 21:05, No significant change was found Confirmed by 20-Sep-2021 (Paula Libra) on 01/05/2022 10:43:38 PM       Laboratory Studies: Results for orders placed or performed during the hospital encounter of 01/06/22 (from the past 24 hour(s))  Pregnancy, urine     Status: None   Collection Time: 01/06/22 12:12 AM  Result Value Ref Range   Preg Test, Ur NEGATIVE NEGATIVE  Urinalysis, Routine w reflex microscopic Urine, Clean Catch  Status: Abnormal   Collection Time: 01/06/22 12:12 AM  Result Value Ref Range   Color, Urine YELLOW YELLOW   APPearance CLEAR CLEAR   Specific Gravity, Urine 1.014 1.005 - 1.030   pH 6.5 5.0 - 8.0   Glucose, UA NEGATIVE NEGATIVE mg/dL   Hgb urine dipstick LARGE (A) NEGATIVE   Bilirubin Urine NEGATIVE NEGATIVE   Ketones, ur NEGATIVE NEGATIVE mg/dL   Protein, ur NEGATIVE NEGATIVE mg/dL   Nitrite NEGATIVE NEGATIVE   Leukocytes,Ua NEGATIVE  NEGATIVE   RBC / HPF 0-5 0 - 5 RBC/hpf   WBC, UA 0-5 0 - 5 WBC/hpf   Squamous Epithelial / LPF 6-10 0 - 5   Mucus PRESENT   Basic metabolic panel     Status: Abnormal   Collection Time: 01/06/22  1:29 AM  Result Value Ref Range   Sodium 138 135 - 145 mmol/L   Potassium 3.4 (L) 3.5 - 5.1 mmol/L   Chloride 104 98 - 111 mmol/L   CO2 25 22 - 32 mmol/L   Glucose, Bld 85 70 - 99 mg/dL   BUN 11 6 - 20 mg/dL   Creatinine, Ser 1.02 0.44 - 1.00 mg/dL   Calcium 9.4 8.9 - 58.5 mg/dL   GFR, Estimated >27 >78 mL/min   Anion gap 9 5 - 15  CBC     Status: Abnormal   Collection Time: 01/06/22  1:29 AM  Result Value Ref Range   WBC 8.7 4.0 - 10.5 K/uL   RBC 4.11 3.87 - 5.11 MIL/uL   Hemoglobin 10.5 (L) 12.0 - 15.0 g/dL   HCT 24.2 (L) 35.3 - 61.4 %   MCV 80.0 80.0 - 100.0 fL   MCH 25.5 (L) 26.0 - 34.0 pg   MCHC 31.9 30.0 - 36.0 g/dL   RDW 43.1 (H) 54.0 - 08.6 %   Platelets 438 (H) 150 - 400 K/uL   nRBC 0.0 0.0 - 0.2 %  Troponin I (High Sensitivity)     Status: None   Collection Time: 01/06/22  1:29 AM  Result Value Ref Range   Troponin I (High Sensitivity) 3 <18 ng/L  Lipase, blood     Status: None   Collection Time: 01/06/22  1:29 AM  Result Value Ref Range   Lipase 19 11 - 51 U/L   Imaging Studies: DG Chest 2 View  Result Date: 01/05/2022 CLINICAL DATA:  Chest pain EXAM: CHEST - 2 VIEW COMPARISON:  09/18/2021 FINDINGS: The heart size and mediastinal contours are within normal limits. Both lungs are clear. The visualized skeletal structures are unremarkable. IMPRESSION: No active cardiopulmonary disease. Electronically Signed   By: Burman Nieves M.D.   On: 01/05/2022 21:15   DG Hand Complete Right  Result Date: 01/05/2022 CLINICAL DATA:  Right hand pain EXAM: RIGHT HAND - COMPLETE 3+ VIEW COMPARISON:  None Available. FINDINGS: There is no evidence of fracture or dislocation. There is no evidence of arthropathy or other focal bone abnormality. Soft tissues are unremarkable. IMPRESSION:  Negative. Electronically Signed   By: Burman Nieves M.D.   On: 01/05/2022 21:15    ED COURSE and MDM  Nursing notes, initial and subsequent vitals signs, including pulse oximetry, reviewed and interpreted by myself.  Vitals:   01/06/22 0015 01/06/22 0100 01/06/22 0115 01/06/22 0215  BP: (!) 173/105 (!) 140/93 (!) 156/99 (!) 147/101  Pulse: 86 66 69 62  Resp: 19  18 18   Temp:      SpO2: 100% 100% 99% 100%  Weight:  Height:       Medications  naproxen (NAPROSYN) tablet 500 mg (has no administration in time range)    The patient has known iron deficiency anemia and this has improved in the last 3 months due to iron treatment.  Her right hand pain is likely some arthralgias due to overuse recently.  The cause of her chest discomfort is unclear but she has an EKG that is unchanged and showing no acute ischemic changes.  Her troponin is normal.  We will treat her hand discomfort with naproxen.   PROCEDURES  Procedures   ED DIAGNOSES     ICD-10-CM   1. Right hand pain  M79.641     2. Atypical chest pain  R07.89          Paula LibraMolpus, Mileydi Milsap, MD 01/06/22 [id number]

## 2022-01-06 NOTE — ED Notes (Signed)
2 unsuccessful US guided IV attempts.

## 2022-01-06 NOTE — ED Notes (Signed)
Pt verbalizes understanding of discharge instructions. Opportunity for questioning and answers were provided. Pt discharged from ED to home with mother.   ? ?

## 2022-07-16 ENCOUNTER — Encounter (HOSPITAL_BASED_OUTPATIENT_CLINIC_OR_DEPARTMENT_OTHER): Payer: Self-pay | Admitting: *Deleted

## 2022-07-16 ENCOUNTER — Emergency Department (HOSPITAL_BASED_OUTPATIENT_CLINIC_OR_DEPARTMENT_OTHER)
Admission: EM | Admit: 2022-07-16 | Discharge: 2022-07-16 | Disposition: A | Discharge status: Home / Self Care | Payer: No Typology Code available for payment source | Attending: Emergency Medicine | Admitting: Emergency Medicine

## 2022-07-16 ENCOUNTER — Other Ambulatory Visit: Payer: Self-pay

## 2022-07-16 DIAGNOSIS — S61250A Open bite of right index finger without damage to nail, initial encounter: Secondary | ICD-10-CM | POA: Diagnosis present

## 2022-07-16 DIAGNOSIS — W540XXA Bitten by dog, initial encounter: Secondary | ICD-10-CM | POA: Diagnosis not present

## 2022-07-16 DIAGNOSIS — Y929 Unspecified place or not applicable: Secondary | ICD-10-CM | POA: Insufficient documentation

## 2022-07-16 DIAGNOSIS — Y999 Unspecified external cause status: Secondary | ICD-10-CM | POA: Insufficient documentation

## 2022-07-16 DIAGNOSIS — I10 Essential (primary) hypertension: Secondary | ICD-10-CM | POA: Diagnosis not present

## 2022-07-16 DIAGNOSIS — Z23 Encounter for immunization: Secondary | ICD-10-CM | POA: Diagnosis not present

## 2022-07-16 DIAGNOSIS — Y9389 Activity, other specified: Secondary | ICD-10-CM | POA: Diagnosis not present

## 2022-07-16 DIAGNOSIS — S61258A Open bite of other finger without damage to nail, initial encounter: Secondary | ICD-10-CM

## 2022-07-16 MED ORDER — AMOXICILLIN-POT CLAVULANATE 875-125 MG PO TABS: 1.0000 | ORAL_TABLET | Freq: Two times a day (BID) | ORAL | 0 refills | Status: DC

## 2022-07-16 MED ORDER — AMOXICILLIN-POT CLAVULANATE 875-125 MG PO TABS
1.0000 | ORAL_TABLET | Freq: Once | ORAL | Status: AC
  Administered 2022-07-16: 1 via ORAL
  Filled 2022-07-16: qty 1

## 2022-07-16 MED ORDER — TETANUS-DIPHTH-ACELL PERTUSSIS 5-2.5-18.5 LF-MCG/0.5 IM SUSY
0.5000 mL | PREFILLED_SYRINGE | Freq: Once | INTRAMUSCULAR | Status: AC
  Administered 2022-07-16: 0.5 mL via INTRAMUSCULAR
  Filled 2022-07-16: qty 0.5

## 2022-07-16 NOTE — ED Provider Notes (Signed)
[facility] EMERGENCY DEPT Provider Note   CSN: [id number] Arrival date & time: 07/16/22  1937     History  Chief Complaint  Patient presents with   Animal Bite    Summit Borchardt is a 31 y.o. female.  The history is provided by the patient.  Animal Bite She has history of hypertension and comes in because of dog bite to her right index finger.  Her dog was chewing on a bone which she tried to get away from the dog and the dog bit her finger.  Dog is up-to-date on its immunizations, but patient does not know when her last tetanus immunization was.   Home Medications Prior to Admission medications   Medication Sig Start Date End Date Taking? Authorizing Provider  ferrous sulfate 325 (65 FE) MG tablet Take 1 tablet (325 mg total) by mouth 3 (three) times daily with meals. 09/19/21 10/19/21  Couture, Cortni S, PA-C  HYDROcodone-acetaminophen (NORCO/VICODIN) 5-325 MG tablet Take 1 tablet by mouth every 6 (six) hours as needed. 09/19/21   Couture, Cortni S, PA-C  naproxen (NAPROSYN) 375 MG tablet Take 1 tablet twice daily as needed for pain. 01/06/22   Molpus, John, MD      Allergies    Lisinopril    Review of Systems   Review of Systems  All other systems reviewed and are negative.   Physical Exam Updated Vital Signs BP (!) 163/102 (BP Location: Left Wrist)   Pulse 91   Temp 98.5 F (36.9 C) (Oral)   Resp 18   SpO2 100%  Physical Exam Vitals and nursing note reviewed.   31 year old female, resting comfortably and in no acute distress. Vital signs are significant for elevated blood pressure. Oxygen saturation is 100%, which is normal. Head is normocephalic and atraumatic. PERRLA, EOMI. Oropharynx is clear. Lungs are clear without rales, wheezes, or rhonchi. Chest is nontender. Heart has regular rate and rhythm without murmur. Abdomen is soft, flat, nontender. Extremities: There are 2 puncture wounds on the dorsum of the right second finger proximal phalanx.   There is mild soft tissue swelling of the proximal phalanx of the right second finger with some swelling extending into the palm.  Neurovascular and tendon function are normal. Skin is warm and dry without rash. Neurologic: Mental status is normal, cranial nerves are intact, moves all extremities equally.   Media Information  Document Information  Photos    07/16/2022 23:14  Attached To:  Hospital Encounter on 07/16/22  Source Information  Dione Booze, MD  [facility]   ED Results / Procedures / Treatments    Procedures Procedures    Medications Ordered in ED Medications  Tdap (BOOSTRIX) injection 0.5 mL (0.5 mLs Intramuscular Given 07/16/22 2322)  amoxicillin-clavulanate (AUGMENTIN) 875-125 MG per tablet 1 tablet (1 tablet Oral Given 07/16/22 2321)    ED Course/ Medical Decision Making/ A&P                           Medical Decision Making  Dog bite of the right second finger.  There are rather superficial punctures present, no laceration which needs repair.  I reviewed her past records, I see no record of tetanus immunizations, so Tdap booster is ordered.  I have ordered a dose of amoxicillin-clavulanate and I am discharging the patient with a prescription for amoxicillin-clavulanate for the next 5 days.  I have advised follow-up with primary care provider in 3 days if signs  of infection are starting to develop.  Final Clinical Impression(s) / ED Diagnoses Final diagnoses:  Dog bite of index finger, initial encounter  Elevated blood pressure reading with diagnosis of hypertension    Rx / DC Orders ED Discharge Orders          Ordered    amoxicillin-clavulanate (AUGMENTIN) 875-125 MG tablet  Every 12 hours        07/16/22 2318              Dione Booze, MD 07/16/22 2325

## 2022-07-16 NOTE — Discharge Instructions (Signed)
Soak your finger in warm water before going to bed tonight.  After that, apply ice for 30 minutes at a time, 4 times a day.  You may take ibuprofen and/or acetaminophen as needed for pain.  If signs of infection develop, see your primary care provider.

## 2022-07-16 NOTE — ED Triage Notes (Addendum)
Pt was attempting to get something out of her dogs mouth and was bitten on her right hand.  Pt has some bruising and 2 superficial wounds that broke the skin.  No bleeding.  Pt states that her dog is up to date on rabies vaccine.  Last tetanus shot unknown

## 2022-09-14 ENCOUNTER — Encounter (INDEPENDENT_AMBULATORY_CARE_PROVIDER_SITE_OTHER): Payer: Self-pay | Admitting: Family Medicine

## 2022-09-16 NOTE — Progress Notes (Deleted)
[place]   [phone] Fax:([phone]) [place] consult Note   Patient Care Team: Janie Morning, DO as PCP - General (Family Medicine) 09/16/2022  CHIEF COMPLAINTS/PURPOSE OF CONSULTATION:  ***  HISTORY OF PRESENTING ILLNESS:  Stacey Reynolds 32 y.o. female is here because of ***  ***She was found to have abnormal CBC from *** ***She denies recent chest pain on exertion, shortness of breath on minimal exertion, pre-syncopal episodes, or palpitations. ***She had not noticed any recent bleeding such as epistaxis, hematuria or hematochezia ***The patient denies over the counter NSAID ingestion. She is not *** on antiplatelets agents. Her last colonoscopy was *** ***She had no prior history or diagnosis of cancer. Her age appropriate screening programs are up-to-date. ***She denies any pica and eats a variety of diet. ***She never donated blood or received blood transfusion ***The patient was prescribed oral iron supplements and she takes ***  MEDICAL HISTORY:  Past Medical History:  Diagnosis Date   Hypertension    Obesity     SURGICAL HISTORY: Past Surgical History:  Procedure Laterality Date   TONSILLECTOMY      SOCIAL HISTORY: Social History   Socioeconomic History   Marital status: Single    Spouse name: Not on file   Number of children: Not on file   Years of education: Not on file   Highest education level: Not on file  Occupational History   Not on file  Tobacco Use   Smoking status: Never   Smokeless tobacco: Never  Substance and Sexual Activity   Alcohol use: Yes    Comment: occasionally   Drug use: No   Sexual activity: Yes    Birth control/protection: Condom    Comment: occasional condom use  Other Topics Concern   Not on file  Social History Narrative   Not on file   Social Determinants of Health   Financial Resource Strain: Not on file  Food Insecurity: Not on file  Transportation Needs: Not on  file  Physical Activity: Not on file  Stress: Not on file  Social Connections: Not on file  Intimate Partner Violence: Not on file    FAMILY HISTORY: Family History  Problem Relation Age of Onset   Healthy Mother    Cancer Father     ALLERGIES:  is allergic to lisinopril.  MEDICATIONS:  Current Outpatient Medications  Medication Sig Dispense Refill   amoxicillin-clavulanate (AUGMENTIN) 875-125 MG tablet Take 1 tablet by mouth every 12 (twelve) hours. 10 tablet 0   ferrous sulfate 325 (65 FE) MG tablet Take 1 tablet (325 mg total) by mouth 3 (three) times daily with meals. 90 tablet 0   HYDROcodone-acetaminophen (NORCO/VICODIN) 5-325 MG tablet Take 1 tablet by mouth every 6 (six) hours as needed. 12 tablet 0   naproxen (NAPROSYN) 375 MG tablet Take 1 tablet twice daily as needed for pain. 20 tablet 0   No current facility-administered medications for this visit.    REVIEW OF SYSTEMS:   Constitutional: Denies fevers, chills or abnormal night sweats Eyes: Denies blurriness of vision, double vision or watery eyes Ears, nose, mouth, throat, and face: Denies mucositis or sore throat Respiratory: Denies cough, dyspnea or wheezes Cardiovascular: Denies palpitation, chest discomfort or lower extremity swelling Gastrointestinal:  Denies nausea, heartburn or change in bowel habits Skin: Denies abnormal skin rashes Lymphatics: Denies new lymphadenopathy or easy bruising Neurological:Denies numbness, tingling or new weaknesses Behavioral/Psych: Mood is stable, no new changes  All other systems were  reviewed with the patient and are negative.  PHYSICAL EXAMINATION: ECOG PERFORMANCE STATUS: {CHL ONC ECOG [phone]}  There were no vitals filed for this visit. There were no vitals filed for this visit.  GENERAL:alert, no distress and comfortable SKIN: skin color, texture, turgor are normal, no rashes or significant lesions EYES: normal, conjunctiva are pink and non-injected, sclera  clear OROPHARYNX:no exudate, no erythema and lips, buccal mucosa, and tongue normal  NECK: supple, thyroid normal size, non-tender, without nodularity LYMPH:  no palpable lymphadenopathy in the cervical, axillary or inguinal LUNGS: clear to auscultation and percussion with normal breathing effort HEART: regular rate & rhythm and no murmurs and no lower extremity edema ABDOMEN:abdomen soft, non-tender and normal bowel sounds Musculoskeletal:no cyanosis of digits and no clubbing  PSYCH: alert & oriented x 3 with fluent speech NEURO: no focal motor/sensory deficits  LABORATORY DATA:  I have reviewed the data as listed    Latest Ref Rng & Units 01/06/2022    1:29 AM 09/19/2021    2:22 AM 09/18/2021   10:31 PM  CBC  WBC 4.0 - 10.5 K/uL 8.7  15.2    Hemoglobin 12.0 - 15.0 g/dL 10.5  7.3  9.9   Hematocrit 36.0 - 46.0 % 32.9  26.2  29.0   Platelets 150 - 400 K/uL 438  521         Latest Ref Rng & Units 01/06/2022    1:29 AM 09/18/2021   10:31 PM 09/18/2021   10:17 PM  CMP  Glucose 70 - 99 mg/dL 85  120  120   BUN 6 - 20 mg/dL '11  10  10   '$ Creatinine 0.44 - 1.00 mg/dL 0.83  0.80  0.91   Sodium 135 - 145 mmol/L 138  140  136   Potassium 3.5 - 5.1 mmol/L 3.4  3.3  3.5   Chloride 98 - 111 mmol/L 104  105  105   CO2 22 - 32 mmol/L 25   21   Calcium 8.9 - 10.3 mg/dL 9.4   9.0   Total Protein 6.5 - 8.1 g/dL   7.5   Total Bilirubin 0.3 - 1.2 mg/dL   0.6   Alkaline Phos 38 - 126 U/L   67   AST 15 - 41 U/L   76   ALT 0 - 44 U/L   49      RADIOGRAPHIC STUDIES: I have personally reviewed the radiological images as listed and agreed with the findings in the report. No results found.  ASSESSMENT & PLAN:  No problem-specific Assessment & Plan notes found for this encounter.    All questions were answered. The patient knows to call the clinic with any problems, questions or concerns. I spent {CHL ONC TIME VISIT - [id number] counseling the patient face to face. The total time spent in  the appointment was {CHL ONC TIME VISIT - [id number] and more than 50% was on counseling.     Truitt Merle, MD 09/16/22 10:38 PM

## 2022-09-17 ENCOUNTER — Inpatient Hospital Stay: Payer: No Typology Code available for payment source

## 2022-09-17 ENCOUNTER — Inpatient Hospital Stay: Payer: No Typology Code available for payment source | Attending: Hematology | Admitting: Hematology

## 2022-09-17 DIAGNOSIS — D75838 Other thrombocytosis: Secondary | ICD-10-CM | POA: Insufficient documentation

## 2022-09-17 DIAGNOSIS — Z79899 Other long term (current) drug therapy: Secondary | ICD-10-CM | POA: Insufficient documentation

## 2022-09-17 DIAGNOSIS — N920 Excessive and frequent menstruation with regular cycle: Secondary | ICD-10-CM | POA: Insufficient documentation

## 2022-09-17 DIAGNOSIS — I10 Essential (primary) hypertension: Secondary | ICD-10-CM | POA: Insufficient documentation

## 2022-09-17 DIAGNOSIS — D500 Iron deficiency anemia secondary to blood loss (chronic): Secondary | ICD-10-CM | POA: Insufficient documentation

## 2022-09-19 ENCOUNTER — Telehealth: Payer: Self-pay | Admitting: Hematology

## 2022-09-19 NOTE — Telephone Encounter (Signed)
R/s pt's  missed appt. Pt is aware of new appt date/time.  

## 2022-09-26 ENCOUNTER — Telehealth: Payer: Self-pay | Admitting: Hematology

## 2022-09-26 NOTE — Telephone Encounter (Signed)
Contacted patient to scheduled appointments. Patient is aware of appointments that are scheduled.   

## 2022-10-07 ENCOUNTER — Encounter: Payer: No Typology Code available for payment source | Admitting: Hematology

## 2022-10-10 ENCOUNTER — Inpatient Hospital Stay: Payer: No Typology Code available for payment source | Admitting: Hematology and Oncology

## 2022-10-10 ENCOUNTER — Other Ambulatory Visit: Payer: Self-pay

## 2022-10-10 ENCOUNTER — Encounter: Payer: Self-pay | Admitting: Hematology and Oncology

## 2022-10-10 VITALS — BP 189/129 | HR 95 | Temp 97.9°F | Resp 18 | Ht 67.0 in | Wt >= 6400 oz

## 2022-10-10 DIAGNOSIS — N938 Other specified abnormal uterine and vaginal bleeding: Secondary | ICD-10-CM

## 2022-10-10 DIAGNOSIS — D75838 Other thrombocytosis: Secondary | ICD-10-CM | POA: Diagnosis not present

## 2022-10-10 DIAGNOSIS — Z79899 Other long term (current) drug therapy: Secondary | ICD-10-CM | POA: Diagnosis not present

## 2022-10-10 DIAGNOSIS — I10 Essential (primary) hypertension: Secondary | ICD-10-CM

## 2022-10-10 DIAGNOSIS — D500 Iron deficiency anemia secondary to blood loss (chronic): Secondary | ICD-10-CM

## 2022-10-10 DIAGNOSIS — N920 Excessive and frequent menstruation with regular cycle: Secondary | ICD-10-CM

## 2022-10-10 MED ORDER — FERROUS SULFATE 325 (65 FE) MG PO TABS: 325.0000 mg | ORAL_TABLET | Freq: Every day | ORAL | 0 refills | Status: AC

## 2022-10-10 NOTE — Assessment & Plan Note (Signed)
The most likely cause of her anemia is due to chronic blood loss/malabsorption syndrome. We discussed some of the risks, benefits, and alternatives of intravenous iron infusions. The patient is symptomatic from anemia and the iron level is critically low. She tolerated oral iron supplement poorly and desires to achieved higher levels of iron faster for adequate hematopoesis. Some of the side-effects to be expected including risks of infusion reactions, phlebitis, headaches, nausea and fatigue.  The patient is willing to proceed. Patient education material was dispensed.  Goal is to keep ferritin level greater than 50 and resolution of anemia Recommend 3 doses of intravenous iron sucrose I will see her back in 2 months for further follow-up We discussed referral back to GYN for management of menorrhagia but the patient is not interested right now as she is trying to become pregnant

## 2022-10-10 NOTE — Assessment & Plan Note (Signed)
Blood pressure is very high likely due to whitecoat hypertension She will continue antihypertensive management by her primary care doctor

## 2022-10-10 NOTE — Assessment & Plan Note (Signed)
Likely due to iron deficiency Anticipate improvement with resolution of anemia after intravenous iron infusion

## 2022-10-10 NOTE — Progress Notes (Signed)
[place] CONSULT NOTE  Patient Care Team: Janie Morning, DO as PCP - General (Family Medicine)  ASSESSMENT & PLAN:  Iron deficiency anemia due to chronic blood loss The most likely cause of her anemia is due to chronic blood loss/malabsorption syndrome. We discussed some of the risks, benefits, and alternatives of intravenous iron infusions. The patient is symptomatic from anemia and the iron level is critically low. She tolerated oral iron supplement poorly and desires to achieved higher levels of iron faster for adequate hematopoesis. Some of the side-effects to be expected including risks of infusion reactions, phlebitis, headaches, nausea and fatigue.  The patient is willing to proceed. Patient education material was dispensed.  Goal is to keep ferritin level greater than 50 and resolution of anemia Recommend 3 doses of intravenous iron sucrose I will see her back in 2 months for further follow-up We discussed referral back to GYN for management of menorrhagia but the patient is not interested right now as she is trying to become pregnant  DUB (dysfunctional uterine bleeding) She was diagnosed with dysfunctional uterine bleeding As above, she is not interested for GYN referral right now  Reactive thrombocytosis Likely due to iron deficiency Anticipate improvement with resolution of anemia after intravenous iron infusion  Essential hypertension Blood pressure is very high likely due to whitecoat hypertension She will continue antihypertensive management by her primary care doctor  Orders Placed This Encounter  Procedures   CBC with Differential ([place] Only)    Standing Status:   Future    Standing Expiration Date:   10/11/2023   Iron and Iron Binding Capacity ([facility] only)    Standing Status:   Future    Standing Expiration Date:   10/11/2023   Ferritin    Standing Status:   Future    Standing Expiration Date:   10/10/2023   Sedimentation rate     Standing Status:   Future    Standing Expiration Date:   10/11/2023   Vitamin B12    Standing Status:   Future    Standing Expiration Date:   10/11/2023   ABO/Rh    Standing Status:   Future    Standing Expiration Date:   10/11/2023    All questions were answered. The patient knows to call the clinic with any problems, questions or concerns.  The total time spent in the appointment was 60 minutes encounter with patients including review of chart and various tests results, discussions about plan of care and coordination of care plan  Heath Lark, MD 2/26/20242:44 PM   CHIEF COMPLAINTS/PURPOSE OF CONSULTATION:  Anemia  HISTORY OF PRESENTING ILLNESS:  Stacey Reynolds 32 y.o. female is here because of anemia  She was found to have abnormal CBC from routine blood draw I have the opportunity to review her CBC dated back to 2017 On August 28, 2015, her hemoglobin was 4.9 On October 01, 2020, hemoglobin was 12.6 Over the past few years, her hemoglobin fluctuates Most recently, on September 05, 2022, her hemoglobin was 9.2, platelet count 680, normal thyroid function test and ferritin low at 13.6  She denies recent chest pain on exertion, shortness of breath on minimal exertion, pre-syncopal episodes, or palpitations.  She complains of leg cramps She had not noticed any recent bleeding such as epistaxis, hematuria or hematochezia She has significant menorrhagia with regular cycle Her menstrual cycle last 1 to 2 weeks with passage of large amount of clots She has not been on birth control for  4 years as she is trying to become pregnant The patient denies over the counter NSAID ingestion. She is not on antiplatelets agents.  She had no prior history or diagnosis of cancer. Her age appropriate screening programs are up-to-date. She denies any pica and eats a variety of diet. She never donated blood; she had received blood transfusion in 2017 The patient was prescribed oral iron  supplements and she takes once daily since January of this year She denies nausea or constipation with iron supplement  MEDICAL HISTORY:  Past Medical History:  Diagnosis Date   Anemia    Hypertension    Obesity    Prediabetes     SURGICAL HISTORY: Past Surgical History:  Procedure Laterality Date   TONSILLECTOMY      SOCIAL HISTORY: Social History   Socioeconomic History   Marital status: Single    Spouse name: Not on file   Number of children: Not on file   Years of education: Not on file   Highest education level: Not on file  Occupational History   Not on file  Tobacco Use   Smoking status: Never   Smokeless tobacco: Never  Substance and Sexual Activity   Alcohol use: Yes    Comment: occasionally   Drug use: No   Sexual activity: Yes    Birth control/protection: Condom    Comment: occasional condom use  Other Topics Concern   Not on file  Social History Narrative   Not on file   Social Determinants of Health   Financial Resource Strain: Not on file  Food Insecurity: Not on file  Transportation Needs: Not on file  Physical Activity: Not on file  Stress: Not on file  Social Connections: Not on file  Intimate Partner Violence: Not on file    FAMILY HISTORY: Family History  Problem Relation Age of Onset   Healthy Mother    Cancer Father     ALLERGIES:  is allergic to lisinopril.  MEDICATIONS:  Current Outpatient Medications  Medication Sig Dispense Refill   hydrochlorothiazide (HYDRODIURIL) 25 MG tablet 1 tablet in the morning Orally Once a day for 90 days     tiZANidine (ZANAFLEX) 2 MG tablet 1-2 tablets as needed Orally Three times a day for neck pain/stiffness for 30 days     valsartan (DIOVAN) 320 MG tablet 1 tablet Orally Once a day for 90 days     ferrous sulfate 325 (65 FE) MG tablet Take 1 tablet (325 mg total) by mouth daily with breakfast. 30 tablet 0   No current facility-administered medications for this visit.    REVIEW OF  SYSTEMS:   Constitutional: Denies fevers, chills or abnormal night sweats Eyes: Denies blurriness of vision, double vision or watery eyes Ears, nose, mouth, throat, and face: Denies mucositis or sore throat Respiratory: Denies cough, dyspnea or wheezes Cardiovascular: Denies palpitation, chest discomfort or lower extremity swelling Gastrointestinal:  Denies nausea, heartburn or change in bowel habits Skin: Denies abnormal skin rashes Lymphatics: Denies new lymphadenopathy or easy bruising Neurological:Denies numbness, tingling or new weaknesses Behavioral/Psych: Mood is stable, no new changes  All other systems were reviewed with the patient and are negative.  PHYSICAL EXAMINATION: ECOG PERFORMANCE STATUS: 1 - Symptomatic but completely ambulatory  Vitals:   10/10/22 1317  BP: (!) 189/129  Pulse: 95  Resp: 18  Temp: 97.9 F (36.6 C)  SpO2: 100%   Filed Weights   10/10/22 1317  Weight: (!) 401 lb 9.6 oz (182.2 kg)  GENERAL:alert, no distress and comfortable SKIN: skin color, texture, turgor are normal, no rashes or significant lesions EYES: normal, conjunctiva are pink and non-injected, sclera clear OROPHARYNX:no exudate, no erythema and lips, buccal mucosa, and tongue normal  NECK: supple, thyroid normal size, non-tender, without nodularity LYMPH:  no palpable lymphadenopathy in the cervical, axillary or inguinal LUNGS: clear to auscultation and percussion with normal breathing effort HEART: regular rate & rhythm and no murmurs and no lower extremity edema ABDOMEN:abdomen soft, non-tender and normal bowel sounds Musculoskeletal:no cyanosis of digits and no clubbing  PSYCH: alert & oriented x 3 with fluent speech NEURO: no focal motor/sensory deficits

## 2022-10-10 NOTE — Assessment & Plan Note (Signed)
She was diagnosed with dysfunctional uterine bleeding As above, she is not interested for GYN referral right now

## 2022-10-14 ENCOUNTER — Telehealth: Payer: Self-pay | Admitting: Hematology and Oncology

## 2022-10-14 NOTE — Telephone Encounter (Signed)
Spoke with patient confirming appointment change

## 2022-10-21 ENCOUNTER — Inpatient Hospital Stay: Payer: No Typology Code available for payment source | Attending: Hematology and Oncology

## 2022-10-21 ENCOUNTER — Other Ambulatory Visit: Payer: Self-pay

## 2022-10-21 ENCOUNTER — Inpatient Hospital Stay: Payer: No Typology Code available for payment source

## 2022-10-21 VITALS — BP 160/108 | HR 77 | Temp 98.2°F | Resp 18

## 2022-10-21 DIAGNOSIS — N920 Excessive and frequent menstruation with regular cycle: Secondary | ICD-10-CM | POA: Diagnosis present

## 2022-10-21 DIAGNOSIS — D500 Iron deficiency anemia secondary to blood loss (chronic): Secondary | ICD-10-CM | POA: Diagnosis not present

## 2022-10-21 MED ORDER — SODIUM CHLORIDE 0.9 % IV SOLN: Freq: Once | INTRAVENOUS | Status: AC

## 2022-10-21 MED ORDER — SODIUM CHLORIDE 0.9 % IV SOLN
400.0000 mg | Freq: Once | INTRAVENOUS | Status: AC
  Administered 2022-10-21: 400 mg via INTRAVENOUS
  Filled 2022-10-21: qty 20

## 2022-10-21 NOTE — Patient Instructions (Signed)

## 2022-11-04 ENCOUNTER — Inpatient Hospital Stay: Payer: No Typology Code available for payment source

## 2022-11-04 VITALS — BP 178/106 | HR 78 | Temp 97.5°F | Resp 18

## 2022-11-04 DIAGNOSIS — D500 Iron deficiency anemia secondary to blood loss (chronic): Secondary | ICD-10-CM | POA: Diagnosis not present

## 2022-11-04 MED ORDER — SODIUM CHLORIDE 0.9 % IV SOLN: Freq: Once | INTRAVENOUS | Status: AC

## 2022-11-04 MED ORDER — SODIUM CHLORIDE 0.9 % IV SOLN
400.0000 mg | Freq: Once | INTRAVENOUS | Status: AC
  Administered 2022-11-04: 400 mg via INTRAVENOUS
  Filled 2022-11-04: qty 20

## 2022-11-04 NOTE — Patient Instructions (Signed)

## 2022-11-04 NOTE — Progress Notes (Signed)
Patient declined to stay fro full 30 minute post venofer observation. Stayed for approximately 15 minutes. Vital signs elevated but consistent with baseline at time of discharge. Patient ambulated independently to lobby.

## 2022-11-18 ENCOUNTER — Inpatient Hospital Stay: Payer: No Typology Code available for payment source | Attending: Hematology and Oncology

## 2022-11-18 ENCOUNTER — Inpatient Hospital Stay: Payer: No Typology Code available for payment source

## 2022-11-18 ENCOUNTER — Inpatient Hospital Stay (HOSPITAL_BASED_OUTPATIENT_CLINIC_OR_DEPARTMENT_OTHER): Payer: No Typology Code available for payment source | Admitting: Hematology and Oncology

## 2022-11-18 ENCOUNTER — Other Ambulatory Visit: Payer: Self-pay

## 2022-11-18 VITALS — BP 161/119 | HR 75 | Temp 97.7°F | Resp 18 | Ht 67.0 in | Wt >= 6400 oz

## 2022-11-18 DIAGNOSIS — D500 Iron deficiency anemia secondary to blood loss (chronic): Secondary | ICD-10-CM | POA: Insufficient documentation

## 2022-11-18 DIAGNOSIS — N920 Excessive and frequent menstruation with regular cycle: Secondary | ICD-10-CM | POA: Diagnosis present

## 2022-11-18 DIAGNOSIS — I10 Essential (primary) hypertension: Secondary | ICD-10-CM | POA: Diagnosis not present

## 2022-11-18 LAB — CBC WITH DIFFERENTIAL (CANCER CENTER ONLY)
Abs Immature Granulocytes: 0.01 10*3/uL (ref 0.00–0.07)
Basophils Absolute: 0 10*3/uL (ref 0.0–0.1)
Basophils Relative: 0 %
Eosinophils Absolute: 0.1 10*3/uL (ref 0.0–0.5)
Eosinophils Relative: 2 %
HCT: 35.5 % — ABNORMAL LOW (ref 36.0–46.0)
Hemoglobin: 11.3 g/dL — ABNORMAL LOW (ref 12.0–15.0)
Immature Granulocytes: 0 %
Lymphocytes Relative: 31 %
Lymphs Abs: 1.8 10*3/uL (ref 0.7–4.0)
MCH: 24.8 pg — ABNORMAL LOW (ref 26.0–34.0)
MCHC: 31.8 g/dL (ref 30.0–36.0)
MCV: 77.9 fL — ABNORMAL LOW (ref 80.0–100.0)
Monocytes Absolute: 0.3 10*3/uL (ref 0.1–1.0)
Monocytes Relative: 5 %
Neutro Abs: 3.6 10*3/uL (ref 1.7–7.7)
Neutrophils Relative %: 62 %
Platelet Count: 311 10*3/uL (ref 150–400)
RBC: 4.56 MIL/uL (ref 3.87–5.11)
RDW: 26.1 % — ABNORMAL HIGH (ref 11.5–15.5)
WBC Count: 5.9 10*3/uL (ref 4.0–10.5)
nRBC: 0 % (ref 0.0–0.2)

## 2022-11-18 LAB — IRON AND IRON BINDING CAPACITY (CC-WL,HP ONLY)
Iron: 50 ug/dL (ref 28–170)
Saturation Ratios: 16 % (ref 10.4–31.8)
TIBC: 318 ug/dL (ref 250–450)
UIBC: 268 ug/dL (ref 148–442)

## 2022-11-18 LAB — ABO/RH: ABO/RH(D): O POS

## 2022-11-18 LAB — VITAMIN B12: Vitamin B-12: 377 pg/mL (ref 180–914)

## 2022-11-18 LAB — SEDIMENTATION RATE: Sed Rate: 7 mm/hr (ref 0–22)

## 2022-11-18 LAB — FERRITIN: Ferritin: 44 ng/mL (ref 11–307)

## 2022-11-20 ENCOUNTER — Encounter: Payer: Self-pay | Admitting: Hematology and Oncology

## 2022-11-20 NOTE — Assessment & Plan Note (Signed)
She has significant improvement with IV iron However due to poor venous access, we have decided to resume oral iron and not proceed with IV iron I recommend return visit in 6 months

## 2022-11-20 NOTE — Progress Notes (Signed)
[facility] OFFICE PROGRESS NOTE  Stacey Reichmann, DO  ASSESSMENT & PLAN:  Iron deficiency anemia due to chronic blood loss She has significant improvement with IV iron However due to poor venous access, we have decided to resume oral iron and not proceed with IV iron I recommend return visit in 6 months  Essential hypertension Blood pressure is very high likely due to whitecoat hypertension She will continue antihypertensive management by her primary care doctor  No orders of the defined types were placed in this encounter.   The total time spent in the appointment was 20 minutes encounter with patients including review of chart and various tests results, discussions about plan of care and coordination of care plan   All questions were answered. The patient knows to call the clinic with any problems, questions or concerns. No barriers to learning was detected.    Artis Delay, MD 4/7/20241:11 PM  INTERVAL HISTORY: Stacey Reynolds 32 y.o. female returns for follow-up on iron deficiency anemia She felt better with IV iron The patient denies any recent signs or symptoms of bleeding such as spontaneous epistaxis, hematuria or hematochezia.  SUMMARY OF HEMATOLOGIC HISTORY:  She was found to have abnormal CBC from routine blood draw I have the opportunity to review her CBC dated back to 2017 On August 28, 2015, her hemoglobin was 4.9 On October 01, 2020, hemoglobin was 12.6 Over the past few years, her hemoglobin fluctuates Most recently, on September 05, 2022, her hemoglobin was 9.2, platelet count 680, normal thyroid function test and ferritin low at 13.6  She denies recent chest pain on exertion, shortness of breath on minimal exertion, pre-syncopal episodes, or palpitations.  She complains of leg cramps She had not noticed any recent bleeding such as epistaxis, hematuria or hematochezia She has significant menorrhagia with regular cycle Her menstrual  cycle last 1 to 2 weeks with passage of large amount of clots She has not been on birth control for 4 years as she is trying to become pregnant The patient denies over the counter NSAID ingestion. She is not on antiplatelets agents.  She had no prior history or diagnosis of cancer. Her age appropriate screening programs are up-to-date. She denies any pica and eats a variety of diet. She never donated blood; she had received blood transfusion in 2017 The patient was prescribed oral iron supplements and she takes once daily since January of this year She denies nausea or constipation with iron supplement She received iV iron sucrose in March 2024  I have reviewed the past medical history, past surgical history, social history and family history with the patient and they are unchanged from previous note.  ALLERGIES:  is allergic to lisinopril.  MEDICATIONS:  Current Outpatient Medications  Medication Sig Dispense Refill   ferrous sulfate 325 (65 FE) MG tablet Take 1 tablet (325 mg total) by mouth daily with breakfast. 30 tablet 0   hydrochlorothiazide (HYDRODIURIL) 25 MG tablet 1 tablet in the morning Orally Once a day for 90 days     tiZANidine (ZANAFLEX) 2 MG tablet 1-2 tablets as needed Orally Three times a day for neck pain/stiffness for 30 days     valsartan (DIOVAN) 320 MG tablet 1 tablet Orally Once a day for 90 days     No current facility-administered medications for this visit.     REVIEW OF SYSTEMS:   Constitutional: Denies fevers, chills or night sweats Eyes: Denies blurriness of vision Ears, nose, mouth, throat, and face:  Denies mucositis or sore throat Respiratory: Denies cough, dyspnea or wheezes Cardiovascular: Denies palpitation, chest discomfort or lower extremity swelling Gastrointestinal:  Denies nausea, heartburn or change in bowel habits Skin: Denies abnormal skin rashes Lymphatics: Denies new lymphadenopathy or easy bruising Neurological:Denies numbness,  tingling or new weaknesses Behavioral/Psych: Mood is stable, no new changes  All other systems were reviewed with the patient and are negative.  PHYSICAL EXAMINATION: ECOG PERFORMANCE STATUS: 0 - Asymptomatic  Vitals:   11/18/22 1250  BP: (!) 161/119  Pulse: 75  Resp: 18  Temp: 97.7 F (36.5 C)  SpO2: 100%   Filed Weights   11/18/22 1250  Weight: (!) 410 lb 12.8 oz (186.3 kg)    GENERAL:alert, no distress and comfortable  NEURO: alert & oriented x 3 with fluent speech, no focal motor/sensory deficits  LABORATORY DATA:  I have reviewed the data as listed     Component Value Date/Time   NA 138 01/06/2022 0129   K 3.4 (L) 01/06/2022 0129   CL 104 01/06/2022 0129   CO2 25 01/06/2022 0129   GLUCOSE 85 01/06/2022 0129   BUN 11 01/06/2022 0129   CREATININE 0.83 01/06/2022 0129   CALCIUM 9.4 01/06/2022 0129   PROT 7.5 09/18/2021 2217   ALBUMIN 3.8 09/18/2021 2217   AST 76 (H) 09/18/2021 2217   ALT 49 (H) 09/18/2021 2217   ALKPHOS 67 09/18/2021 2217   BILITOT 0.6 09/18/2021 2217   GFRNONAA >60 01/06/2022 0129   GFRAA >60 12/24/2015 1205    No results found for: "SPEP", "UPEP"  Lab Results  Component Value Date   WBC 5.9 11/18/2022   NEUTROABS 3.6 11/18/2022   HGB 11.3 (L) 11/18/2022   HCT 35.5 (L) 11/18/2022   MCV 77.9 (L) 11/18/2022   PLT 311 11/18/2022      Chemistry      Component Value Date/Time   NA 138 01/06/2022 0129   K 3.4 (L) 01/06/2022 0129   CL 104 01/06/2022 0129   CO2 25 01/06/2022 0129   BUN 11 01/06/2022 0129   CREATININE 0.83 01/06/2022 0129      Component Value Date/Time   CALCIUM 9.4 01/06/2022 0129   ALKPHOS 67 09/18/2021 2217   AST 76 (H) 09/18/2021 2217   ALT 49 (H) 09/18/2021 2217   BILITOT 0.6 09/18/2021 2217

## 2022-11-20 NOTE — Assessment & Plan Note (Signed)
Blood pressure is very high likely due to whitecoat hypertension She will continue antihypertensive management by her primary care doctor 

## 2022-11-21 ENCOUNTER — Telehealth: Payer: Self-pay

## 2022-11-21 NOTE — Telephone Encounter (Signed)
-----   Message from Artis Delay, MD sent at 11/21/2022  9:18 AM EDT ----- Let her know her iron studies are ok

## 2022-11-21 NOTE — Telephone Encounter (Signed)
Called and given below message. She verbalized understanding and appreciated the call. 

## 2023-01-11 ENCOUNTER — Ambulatory Visit (HOSPITAL_COMMUNITY)
Admission: EM | Admit: 2023-01-11 | Discharge: 2023-01-11 | Disposition: A | Discharge status: Home / Self Care | Payer: No Typology Code available for payment source | Attending: Nurse Practitioner | Admitting: Nurse Practitioner

## 2023-01-11 ENCOUNTER — Encounter (HOSPITAL_COMMUNITY): Payer: Self-pay

## 2023-01-11 DIAGNOSIS — I10 Essential (primary) hypertension: Secondary | ICD-10-CM | POA: Diagnosis not present

## 2023-01-11 DIAGNOSIS — N3001 Acute cystitis with hematuria: Secondary | ICD-10-CM | POA: Diagnosis not present

## 2023-01-11 DIAGNOSIS — R300 Dysuria: Secondary | ICD-10-CM | POA: Diagnosis present

## 2023-01-11 LAB — POCT URINALYSIS DIP (MANUAL ENTRY)
Glucose, UA: NEGATIVE mg/dL
Nitrite, UA: POSITIVE — AB
Protein Ur, POC: >=300 mg/dL — AB
Spec Grav, UA: 1.015 (ref 1.010–1.025)
Urobilinogen, UA: 1 E.U./dL
pH, UA: 7 (ref 5.0–8.0)

## 2023-01-11 LAB — POCT URINE PREGNANCY: Preg Test, Ur: NEGATIVE

## 2023-01-11 MED ORDER — PHENAZOPYRIDINE HCL 200 MG PO TABS: 200.0000 mg | ORAL_TABLET | Freq: Three times a day (TID) | ORAL | 0 refills | Status: AC

## 2023-01-11 MED ORDER — NITROFURANTOIN MONOHYD MACRO 100 MG PO CAPS: 100.0000 mg | ORAL_CAPSULE | Freq: Two times a day (BID) | ORAL | 0 refills | Status: AC

## 2023-01-11 NOTE — ED Provider Notes (Signed)
[facility]    CSN: [id number] Arrival date & time: 01/11/23  1940      History   Chief Complaint Chief Complaint  Patient presents with   Urinary Tract Infection    HPI Stacey Reynolds is a 32 y.o. female.   Subjective:  Stacey Reynolds is a 32 y.o. female who complains of burning with urination for 1 day.  Patient denies fever, vaginal discharge, urinary frequency, odor, low back pain, nausea, vomiting, headache, dizziness or visual disturbances. Patient does not have a history of recurrent UTI.  Patient does not have a history of pyelonephritis.  She is currently on her menses.  The following portions of the patient's history were reviewed and updated as appropriate: allergies, current medications, past family history, past medical history, past social history, past surgical history, and problem list.        Past Medical History:  Diagnosis Date   Anemia    Hypertension    Obesity    Prediabetes     Patient Active Problem List   Diagnosis Date Noted   Iron deficiency anemia due to chronic blood loss 10/10/2022   Reactive thrombocytosis 10/10/2022   Goiter 03/02/2018   Essential hypertension 03/02/2018   DUB (dysfunctional uterine bleeding) 09/23/2015   Anemia due to blood loss, chronic 08/29/2015    Past Surgical History:  Procedure Laterality Date   TONSILLECTOMY      OB History   No obstetric history on file.      Home Medications    Prior to Admission medications   Medication Sig Start Date End Date Taking? Authorizing Provider  hydrochlorothiazide (HYDRODIURIL) 25 MG tablet 1 tablet in the morning Orally Once a day for 90 days 11/15/21  Yes [provider]  nitrofurantoin, macrocrystal-monohydrate, (MACROBID) 100 MG capsule Take 1 capsule (100 mg total) by mouth 2 (two) times daily. 01/11/23  Yes Lurline Idol, FNP  phenazopyridine (PYRIDIUM) 200 MG tablet Take 1 tablet (200 mg total) by mouth 3 (three) times  daily. 01/11/23  Yes Lurline Idol, FNP  tiZANidine (ZANAFLEX) 2 MG tablet 1-2 tablets as needed Orally Three times a day for neck pain/stiffness for 30 days 09/23/21  Yes [provider]  valsartan (DIOVAN) 320 MG tablet 1 tablet Orally Once a day for 90 days 11/15/21  Yes [provider]  ferrous sulfate 325 (65 FE) MG tablet Take 1 tablet (325 mg total) by mouth daily with breakfast. 10/10/22 11/09/22  Artis Delay, MD    Family History Family History  Problem Relation Age of Onset   Healthy Mother    Cancer Father     Social History Social History   Tobacco Use   Smoking status: Never   Smokeless tobacco: Never  Substance Use Topics   Alcohol use: Yes    Comment: occasionally   Drug use: No     Allergies   Lisinopril   Review of Systems Review of Systems  Constitutional:  Negative for fever.  Eyes:  Negative for visual disturbance.  Cardiovascular:  Negative for chest pain.  Gastrointestinal:  Negative for nausea and vomiting.  Genitourinary:  Positive for dysuria. Negative for vaginal discharge.  Musculoskeletal:  Negative for back pain.  Neurological:  Negative for dizziness and headaches.  All other systems reviewed and are negative.    Physical Exam Triage Vital Signs ED Triage Vitals [01/11/23 1947]  Enc Vitals Group     BP (!) 184/112     Pulse Rate 82  Resp 18     Temp 98.4 F (36.9 C)     Temp Source Oral     SpO2 98 %     Weight      Height      Head Circumference      Peak Flow      Pain Score      Pain Loc      Pain Edu?      Excl. in GC?    No data found.  Updated Vital Signs BP (!) 184/112 (BP Location: Left Arm)   Pulse 82   Temp 98.4 F (36.9 C) (Oral)   Resp 18   LMP 01/11/2023   SpO2 98%   Visual Acuity Right Eye Distance:   Left Eye Distance:   Bilateral Distance:    Right Eye Near:   Left Eye Near:    Bilateral Near:     Physical Exam Constitutional:      General: She is not in acute  distress.    Appearance: Normal appearance. She is obese. She is not ill-appearing or toxic-appearing.  HENT:     Head: Normocephalic.  Cardiovascular:     Rate and Rhythm: Normal rate.  Pulmonary:     Effort: Pulmonary effort is normal.  Musculoskeletal:        General: Normal range of motion.     Cervical back: Normal range of motion and neck supple.  Skin:    General: Skin is warm and dry.  Neurological:     General: No focal deficit present.     Mental Status: She is alert and oriented to person, place, and time.  Psychiatric:        Mood and Affect: Mood normal.        Behavior: Behavior normal.      UC Treatments / Results  Labs (all labs ordered are listed, but only abnormal results are displayed) Labs Reviewed  POCT URINALYSIS DIP (MANUAL ENTRY) - Abnormal; Notable for the following components:      Result Value   Color, UA red (*)    Clarity, UA cloudy (*)    Bilirubin, UA small (*)    Ketones, POC UA trace (5) (*)    Blood, UA large (*)    Protein Ur, POC >=300 (*)    Nitrite, UA Positive (*)    Leukocytes, UA Moderate (2+) (*)    All other components within normal limits  URINE CULTURE  POCT URINE PREGNANCY    EKG   Radiology No results found.  Procedures Procedures (including critical care time)  Medications Ordered in UC Medications - No data to display  Initial Impression / Assessment and Plan / UC Course  I have reviewed the triage vital signs and the nursing notes.  Pertinent labs & imaging results that were available during my care of the patient were reviewed by me and considered in my medical decision making (see chart for details).    32 year old female with a history of hypertension presents with a 1 day history of dysuria.  She is afebrile and nontoxic.  Urinalysis suggestive of acute cystitis.  Urine cultures pending.  Will start Macrobid empirically as well as Pyridium for symptom management.  Additionally, patient noted to be  hypertensive in the clinic.  Patient reports that she did not take her medication today.  She denies any headache, dizziness or visual disturbances.  Patient briefly educated on importance of taking her blood pressure medications consistently. She should monitor ber BP  closely, maintain adequate hydration and follow up with PCP if symptoms not improving.  Today's evaluation has revealed no signs of a dangerous process. Discussed diagnosis with patient and/or guardian. Patient and/or guardian aware of their diagnosis, possible red flag symptoms to watch out for and need for close follow up. Patient and/or guardian understands verbal and written discharge instructions. Patient and/or guardian comfortable with plan and disposition.  Patient and/or guardian has a clear mental status at this time, good insight into illness (after discussion and teaching) and has clear judgment to make decisions regarding their care  Documentation was completed with the aid of voice recognition software. Transcription may contain typographical errors. Final Clinical Impressions(s) / UC Diagnoses   Final diagnoses:  Dysuria  Acute cystitis with hematuria  Elevated blood pressure reading in office with diagnosis of hypertension     Discharge Instructions      You have an urinary tract infection (UTI). Take antibiotics as prescribed. Make sure you finish all the antibiotics even if you start to feel better. We have sent your urine out for cultures which is a lab test to check for bacteria or other germs in your urine sample. You will only be notified about these results if we have to change your antibiotics.   Make sure you: Empty your bladder often and completely.  Do not hold urine for long periods of time. Empty your bladder after sex. Wipe from front to back after urinating or having a bowel movement  Drink enough fluid to keep your urine pale yellow.  Go to the ED immediately if:  You have severe pain in your  back or your lower abdomen. You have a fever or chills. You have nausea or vomiting.  Managing your hypertension is very important. Over time, hypertension can damage the arteries and decrease blood flow to parts of the body, including the brain, heart, and kidneys. Having untreated or uncontrolled hypertension can lead to heart attack, stroke, weakened blood vessels (aneurysm), heart failure, kidney damage, eye damage, memory/concentration problems and vascular dementia. Please monitor your blood pressure closely and take your medications as prescribed. Go to the ED immediately if you develop a severe headache, dizziness, sudden vision problems, confusion, experience unusual weakness or numbness, severe pain in your chest or abdomen, you vomit repeatedly or have trouble breathing.    ED Prescriptions     Medication Sig Dispense Auth. Provider   nitrofurantoin, macrocrystal-monohydrate, (MACROBID) 100 MG capsule Take 1 capsule (100 mg total) by mouth 2 (two) times daily. 10 capsule Lurline Idol, FNP   phenazopyridine (PYRIDIUM) 200 MG tablet Take 1 tablet (200 mg total) by mouth 3 (three) times daily. 6 tablet Lurline Idol, FNP      PDMP not reviewed this encounter.   Lurline Idol, [place] 01/11/23 2012

## 2023-01-11 NOTE — ED Triage Notes (Signed)
Pt is here for Dysuria symptoms that started yesterday.

## 2023-01-11 NOTE — Discharge Instructions (Addendum)
You have an urinary tract infection (UTI). Take antibiotics as prescribed. Make sure you finish all the antibiotics even if you start to feel better. We have sent your urine out for cultures which is a lab test to check for bacteria or other germs in your urine sample. You will only be notified about these results if we have to change your antibiotics.   Make sure you: Empty your bladder often and completely.  Do not hold urine for long periods of time. Empty your bladder after sex. Wipe from front to back after urinating or having a bowel movement  Drink enough fluid to keep your urine pale yellow.  Go to the ED immediately if:  You have severe pain in your back or your lower abdomen. You have a fever or chills. You have nausea or vomiting.  Managing your hypertension is very important. Over time, hypertension can damage the arteries and decrease blood flow to parts of the body, including the brain, heart, and kidneys. Having untreated or uncontrolled hypertension can lead to heart attack, stroke, weakened blood vessels (aneurysm), heart failure, kidney damage, eye damage, memory/concentration problems and vascular dementia. Please monitor your blood pressure closely and take your medications as prescribed. Go to the ED immediately if you develop a severe headache, dizziness, sudden vision problems, confusion, experience unusual weakness or numbness, severe pain in your chest or abdomen, you vomit repeatedly or have trouble breathing.

## 2023-01-12 LAB — URINE CULTURE

## 2023-01-13 ENCOUNTER — Telehealth (HOSPITAL_COMMUNITY): Payer: Self-pay | Admitting: Emergency Medicine

## 2023-01-13 LAB — URINE CULTURE: Culture: >=100000 — AB

## 2023-01-13 MED ORDER — CEPHALEXIN 500 MG PO CAPS: 500.0000 mg | ORAL_CAPSULE | Freq: Two times a day (BID) | ORAL | 0 refills | Status: AC

## 2023-01-15 ENCOUNTER — Encounter (HOSPITAL_BASED_OUTPATIENT_CLINIC_OR_DEPARTMENT_OTHER): Payer: Self-pay | Admitting: Emergency Medicine

## 2023-01-15 ENCOUNTER — Emergency Department (HOSPITAL_BASED_OUTPATIENT_CLINIC_OR_DEPARTMENT_OTHER)
Admission: EM | Admit: 2023-01-15 | Discharge: 2023-01-15 | Disposition: A | Discharge status: Home / Self Care | Payer: No Typology Code available for payment source | Attending: Emergency Medicine | Admitting: Emergency Medicine

## 2023-01-15 DIAGNOSIS — Z6841 Body Mass Index (BMI) 40.0 and over, adult: Secondary | ICD-10-CM | POA: Insufficient documentation

## 2023-01-15 DIAGNOSIS — N760 Acute vaginitis: Secondary | ICD-10-CM | POA: Diagnosis not present

## 2023-01-15 DIAGNOSIS — N939 Abnormal uterine and vaginal bleeding, unspecified: Secondary | ICD-10-CM | POA: Diagnosis present

## 2023-01-15 DIAGNOSIS — B9689 Other specified bacterial agents as the cause of diseases classified elsewhere: Secondary | ICD-10-CM | POA: Insufficient documentation

## 2023-01-15 DIAGNOSIS — N938 Other specified abnormal uterine and vaginal bleeding: Secondary | ICD-10-CM | POA: Insufficient documentation

## 2023-01-15 LAB — CBC
HCT: 29.8 % — ABNORMAL LOW (ref 36.0–46.0)
Hemoglobin: 9.6 g/dL — ABNORMAL LOW (ref 12.0–15.0)
MCH: 26.3 pg (ref 26.0–34.0)
MCHC: 32.2 g/dL (ref 30.0–36.0)
MCV: 81.6 fL (ref 80.0–100.0)
Platelets: 448 10*3/uL — ABNORMAL HIGH (ref 150–400)
RBC: 3.65 MIL/uL — ABNORMAL LOW (ref 3.87–5.11)
RDW: 18.6 % — ABNORMAL HIGH (ref 11.5–15.5)
WBC: 7.3 10*3/uL (ref 4.0–10.5)
nRBC: 0 % (ref 0.0–0.2)

## 2023-01-15 LAB — WET PREP, GENITAL
Sperm: NONE SEEN
Trich, Wet Prep: NONE SEEN
WBC, Wet Prep HPF POC: <10 (ref <10)
Yeast Wet Prep HPF POC: NONE SEEN

## 2023-01-15 LAB — PREGNANCY, URINE: Preg Test, Ur: NEGATIVE

## 2023-01-15 MED ORDER — METRONIDAZOLE 500 MG PO TABS: 500.0000 mg | ORAL_TABLET | Freq: Two times a day (BID) | ORAL | 0 refills | Status: AC

## 2023-01-15 NOTE — ED Provider Notes (Signed)
[place] EMERGENCY DEPARTMENT AT [facility] Provider Note   CSN: [id number] Arrival date & time: 01/15/23  1959     History  Chief Complaint  Patient presents with   Vaginal Bleeding    Stacey Reynolds is a 32 y.o. female.  32 yo F with a chief concern of vaginal bleeding.  This has been off and on this month.  She has struggled with irregular menstrual cycles for some time.  Was started on birth control and had stopped about 5 years ago.  Since then she is irregular at times.  She is undergone iron infusions recently and is known to have chronic low hemoglobin.  She has had some lower abdominal discomfort off and on.  She denies any vaginal discharge.  She was recently seen for urinary symptoms and is currently on antibiotics for this.   Vaginal Bleeding      Home Medications Prior to Admission medications   Medication Sig Start Date End Date Taking? Authorizing Provider  metroNIDAZOLE (FLAGYL) 500 MG tablet Take 1 tablet (500 mg total) by mouth 2 (two) times daily. 01/15/23  Yes Melene Plan, DO  cephALEXin (KEFLEX) 500 MG capsule Take 1 capsule (500 mg total) by mouth 2 (two) times daily for 5 days. 01/13/23 01/18/23  Merrilee Jansky, MD  ferrous sulfate 325 (65 FE) MG tablet Take 1 tablet (325 mg total) by mouth daily with breakfast. 10/10/22 11/09/22  Artis Delay, MD  hydrochlorothiazide (HYDRODIURIL) 25 MG tablet 1 tablet in the morning Orally Once a day for 90 days 11/15/21   [provider]  nitrofurantoin, macrocrystal-monohydrate, (MACROBID) 100 MG capsule Take 1 capsule (100 mg total) by mouth 2 (two) times daily. 01/11/23   Lurline Idol, FNP  phenazopyridine (PYRIDIUM) 200 MG tablet Take 1 tablet (200 mg total) by mouth 3 (three) times daily. 01/11/23   Lurline Idol, FNP  tiZANidine (ZANAFLEX) 2 MG tablet 1-2 tablets as needed Orally Three times a day for neck pain/stiffness for 30 days 09/23/21   [provider]  valsartan (DIOVAN) 320  MG tablet 1 tablet Orally Once a day for 90 days 11/15/21   [provider]      Allergies    Lisinopril    Review of Systems   Review of Systems  Genitourinary:  Positive for vaginal bleeding.    Physical Exam Updated Vital Signs BP (!) 144/65   Pulse 79   Temp 98.1 F (36.7 C) (Oral)   Resp 19   Wt (!) 181.4 kg   LMP 12/18/2022   SpO2 100%   BMI 62.65 kg/m  Physical Exam Vitals and nursing note reviewed.  Constitutional:      General: She is not in acute distress.    Appearance: She is well-developed. She is not diaphoretic.     Comments: BMI 62  HENT:     Head: Normocephalic and atraumatic.  Eyes:     Pupils: Pupils are equal, round, and reactive to light.  Cardiovascular:     Rate and Rhythm: Normal rate and regular rhythm.     Heart sounds: No murmur heard.    No friction rub. No gallop.  Pulmonary:     Effort: Pulmonary effort is normal.     Breath sounds: No wheezing or rales.  Abdominal:     General: There is no distension.     Palpations: Abdomen is soft.     Tenderness: There is no abdominal tenderness.  Genitourinary:    Comments: Blood in the  vault.  No significant hemorrhage.  No cervical motion tenderness no adnexal tenderness or masses. Musculoskeletal:        General: No tenderness.     Cervical back: Normal range of motion and neck supple.  Skin:    General: Skin is warm and dry.  Neurological:     Mental Status: She is alert and oriented to person, place, and time.  Psychiatric:        Behavior: Behavior normal.     ED Results / Procedures / Treatments   Labs (all labs ordered are listed, but only abnormal results are displayed) Labs Reviewed  WET PREP, GENITAL - Abnormal; Notable for the following components:      Result Value   Clue Cells Wet Prep HPF POC PRESENT (*)    All other components within normal limits  CBC - Abnormal; Notable for the following components:   RBC 3.65 (*)    Hemoglobin 9.6 (*)    HCT 29.8 (*)     RDW 18.6 (*)    Platelets 448 (*)    All other components within normal limits  PREGNANCY, URINE  GC/CHLAMYDIA PROBE AMP ([place]) NOT AT [facility]    EKG None  Radiology No results found.  Procedures Procedures    Medications Ordered in ED Medications - No data to display  ED Course/ Medical Decision Making/ A&P                             Medical Decision Making Amount and/or Complexity of Data Reviewed Labs: ordered.  Risk Prescription drug management.   32 yo F with a chief complaints of vaginal bleeding.  Off and on for the past month.  She is concerned that she might be anemic.  She has an appoint with her OB/GYN this week.  Will obtain a pregnancy test here.  CBC.  CBC with a mild drop in her hemoglobin.  Looking back at her hemoglobin trend it seems like she is more often in this range so was recently was at 11 after she had had a transfusion.  No significant bleeding on exam.  Pregnancy test is negative.  OB follow-up.  Does have BV will treat.  11:19 PM:  I have discussed the diagnosis/risks/treatment options with the patient and family.  Evaluation and diagnostic testing in the emergency department does not suggest an emergent condition requiring admission or immediate intervention beyond what has been performed at this time.  They will follow up with GYN. We also discussed returning to the ED immediately if new or worsening sx occur. We discussed the sx which are most concerning (e.g., sudden worsening pain, fever, inability to tolerate by mouth, worsening bleeding, syncope) that necessitate immediate return. Medications administered to the patient during their visit and any new prescriptions provided to the patient are listed below.  Medications given during this visit Medications - No data to display   The patient appears reasonably screen and/or stabilized for discharge and I doubt any other medical condition or other [facility] requiring further screening,  evaluation, or treatment in the ED at this time prior to discharge.          Final Clinical Impression(s) / ED Diagnoses Final diagnoses:  DUB (dysfunctional uterine bleeding)  Bacterial vaginosis    Rx / DC Orders ED Discharge Orders          Ordered    metroNIDAZOLE (FLAGYL) 500 MG tablet  2 times daily  01/15/23 2237              Melene Plan, DO 01/15/23 2319

## 2023-01-15 NOTE — ED Triage Notes (Signed)
Pt arrives pov, to triage in wheelchair, c/o vaginal bleeding x 1 month with ABD pain starting yesterday

## 2023-01-15 NOTE — Discharge Instructions (Signed)
Please call your OB/GYN tomorrow and let them know about your visit to the emergency department.  They may want to go ahead and prescribe you hormone therapy over the phone or may push up your appointment.  Please return for worsening bleeding or if you feel like you get a pass out.  The swab of your vagina is concerning for bacterial vaginosis.  The treatment for that is antibiotics.  I have sent these to the pharmacy on record.

## 2023-01-17 LAB — GC/CHLAMYDIA PROBE AMP (~~LOC~~) NOT AT ARMC
Chlamydia: NEGATIVE
Comment: NEGATIVE
Comment: NORMAL
Neisseria Gonorrhea: NEGATIVE

## 2023-01-19 ENCOUNTER — Other Ambulatory Visit: Payer: Self-pay | Admitting: Nurse Practitioner

## 2023-01-19 ENCOUNTER — Other Ambulatory Visit (HOSPITAL_COMMUNITY)
Admission: RE | Admit: 2023-01-19 | Discharge: 2023-01-19 | Disposition: A | Discharge status: Home / Self Care | Payer: No Typology Code available for payment source | Source: Ambulatory Visit | Attending: Nurse Practitioner | Admitting: Nurse Practitioner

## 2023-01-19 DIAGNOSIS — Z124 Encounter for screening for malignant neoplasm of cervix: Secondary | ICD-10-CM | POA: Diagnosis not present

## 2023-01-20 ENCOUNTER — Other Ambulatory Visit: Payer: Self-pay | Admitting: Nurse Practitioner

## 2023-01-20 DIAGNOSIS — N939 Abnormal uterine and vaginal bleeding, unspecified: Secondary | ICD-10-CM

## 2023-01-26 ENCOUNTER — Ambulatory Visit
Admission: RE | Admit: 2023-01-26 | Discharge: 2023-01-26 | Disposition: A | Discharge status: Home / Self Care | Payer: No Typology Code available for payment source | Source: Ambulatory Visit | Attending: Nurse Practitioner | Admitting: Nurse Practitioner

## 2023-01-26 DIAGNOSIS — N939 Abnormal uterine and vaginal bleeding, unspecified: Secondary | ICD-10-CM

## 2023-01-26 LAB — CYTOLOGY - PAP
Comment: NEGATIVE
Comment: NEGATIVE
Comment: NEGATIVE
Diagnosis: NEGATIVE
HPV 16: NEGATIVE
HPV 18 / 45: NEGATIVE
High risk HPV: POSITIVE — AB

## 2023-05-22 ENCOUNTER — Other Ambulatory Visit: Payer: Self-pay | Admitting: Hematology and Oncology

## 2023-05-22 DIAGNOSIS — D500 Iron deficiency anemia secondary to blood loss (chronic): Secondary | ICD-10-CM

## 2023-05-23 ENCOUNTER — Inpatient Hospital Stay: Payer: No Typology Code available for payment source

## 2023-05-23 ENCOUNTER — Inpatient Hospital Stay
Payer: No Typology Code available for payment source | Attending: Hematology and Oncology | Admitting: Hematology and Oncology

## 2024-02-02 ENCOUNTER — Other Ambulatory Visit: Payer: Self-pay | Admitting: Nurse Practitioner

## 2024-02-02 DIAGNOSIS — N852 Hypertrophy of uterus: Secondary | ICD-10-CM

## 2024-02-06 ENCOUNTER — Other Ambulatory Visit

## 2024-02-09 ENCOUNTER — Ambulatory Visit
Admission: RE | Admit: 2024-02-09 | Discharge: 2024-02-09 | Disposition: A | Discharge status: Home / Self Care | Source: Ambulatory Visit | Attending: Nurse Practitioner | Admitting: Nurse Practitioner

## 2024-02-09 DIAGNOSIS — N852 Hypertrophy of uterus: Secondary | ICD-10-CM

## 2024-03-13 ENCOUNTER — Other Ambulatory Visit: Payer: Self-pay | Admitting: Family Medicine

## 2024-03-13 DIAGNOSIS — R9389 Abnormal findings on diagnostic imaging of other specified body structures: Secondary | ICD-10-CM

## 2024-03-13 DIAGNOSIS — R058 Other specified cough: Secondary | ICD-10-CM

## 2024-03-19 ENCOUNTER — Inpatient Hospital Stay
Admission: RE | Admit: 2024-03-19 | Discharge: 2024-03-19 | Disposition: A | Discharge status: Home / Self Care | Source: Ambulatory Visit | Attending: Family Medicine | Admitting: Family Medicine

## 2024-03-19 DIAGNOSIS — R9389 Abnormal findings on diagnostic imaging of other specified body structures: Secondary | ICD-10-CM

## 2024-03-19 DIAGNOSIS — R058 Other specified cough: Secondary | ICD-10-CM

## 2024-03-19 MED ORDER — IOPAMIDOL (ISOVUE-370) INJECTION 76%
75.0000 mL | Freq: Once | INTRAVENOUS | Status: AC | PRN
  Administered 2024-03-19: 75 mL via INTRAVENOUS
